# Patient Record
Sex: Female | Born: 1975 | Hispanic: Yes | Marital: Single | Smoking: Never smoker
Health system: Southern US, Community
[De-identification: ages and names within clinical notes are randomized; demographics above are authoritative.]

## PROBLEM LIST (undated history)

## (undated) DIAGNOSIS — F32A Depression, unspecified: Secondary | ICD-10-CM

## (undated) DIAGNOSIS — L811 Chloasma: Secondary | ICD-10-CM

## (undated) DIAGNOSIS — F329 Major depressive disorder, single episode, unspecified: Secondary | ICD-10-CM

## (undated) DIAGNOSIS — F419 Anxiety disorder, unspecified: Secondary | ICD-10-CM

## (undated) DIAGNOSIS — R519 Headache, unspecified: Secondary | ICD-10-CM

## (undated) DIAGNOSIS — J45909 Unspecified asthma, uncomplicated: Secondary | ICD-10-CM

## (undated) DIAGNOSIS — R51 Headache: Secondary | ICD-10-CM

## (undated) DIAGNOSIS — E039 Hypothyroidism, unspecified: Secondary | ICD-10-CM

## (undated) HISTORY — DX: Hypothyroidism, unspecified: E03.9

## (undated) HISTORY — PX: OTHER SURGICAL HISTORY: SHX169

## (undated) HISTORY — DX: Major depressive disorder, single episode, unspecified: F32.9

## (undated) HISTORY — DX: Anxiety disorder, unspecified: F41.9

## (undated) HISTORY — DX: Chloasma: L81.1

## (undated) HISTORY — DX: Headache, unspecified: R51.9

## (undated) HISTORY — DX: Headache: R51

## (undated) HISTORY — DX: Unspecified asthma, uncomplicated: J45.909

## (undated) HISTORY — DX: Depression, unspecified: F32.A

---

## 1997-06-15 HISTORY — PX: DIAGNOSTIC LAPAROSCOPY: SUR761

## 2000-06-15 HISTORY — PX: LAPAROSCOPIC OVARIAN CYSTECTOMY: SUR786

## 2002-06-15 HISTORY — PX: THYROID SURGERY: SHX805

## 2009-06-03 ENCOUNTER — Other Ambulatory Visit: Admission: RE | Admit: 2009-06-03 | Discharge: 2009-06-03 | Payer: Self-pay | Admitting: Gynecology

## 2009-06-03 ENCOUNTER — Ambulatory Visit: Payer: Self-pay | Admitting: Gynecology

## 2009-06-06 ENCOUNTER — Ambulatory Visit: Payer: Self-pay | Admitting: Gynecology

## 2009-10-31 ENCOUNTER — Ambulatory Visit: Payer: Self-pay | Admitting: Gynecology

## 2009-12-05 ENCOUNTER — Ambulatory Visit: Payer: Self-pay | Admitting: Gynecology

## 2010-12-05 ENCOUNTER — Encounter: Payer: Self-pay | Admitting: Gynecology

## 2010-12-09 ENCOUNTER — Other Ambulatory Visit: Payer: Self-pay | Admitting: Gynecology

## 2010-12-09 ENCOUNTER — Encounter (INDEPENDENT_AMBULATORY_CARE_PROVIDER_SITE_OTHER): Payer: BC Managed Care – PPO | Admitting: Gynecology

## 2010-12-09 ENCOUNTER — Other Ambulatory Visit (HOSPITAL_COMMUNITY)
Admission: RE | Admit: 2010-12-09 | Discharge: 2010-12-09 | Disposition: A | Discharge status: Home / Self Care | Payer: BC Managed Care – PPO | Source: Ambulatory Visit | Attending: Gynecology | Admitting: Gynecology

## 2010-12-09 DIAGNOSIS — Z13220 Encounter for screening for lipoid disorders: Secondary | ICD-10-CM

## 2010-12-09 DIAGNOSIS — Z833 Family history of diabetes mellitus: Secondary | ICD-10-CM

## 2010-12-09 DIAGNOSIS — Z124 Encounter for screening for malignant neoplasm of cervix: Secondary | ICD-10-CM | POA: Insufficient documentation

## 2010-12-09 DIAGNOSIS — N938 Other specified abnormal uterine and vaginal bleeding: Secondary | ICD-10-CM

## 2010-12-09 DIAGNOSIS — N949 Unspecified condition associated with female genital organs and menstrual cycle: Secondary | ICD-10-CM

## 2010-12-09 DIAGNOSIS — Z01419 Encounter for gynecological examination (general) (routine) without abnormal findings: Secondary | ICD-10-CM

## 2010-12-19 ENCOUNTER — Encounter: Payer: Self-pay | Admitting: *Deleted

## 2011-01-15 ENCOUNTER — Ambulatory Visit (INDEPENDENT_AMBULATORY_CARE_PROVIDER_SITE_OTHER): Payer: BC Managed Care – PPO | Admitting: *Deleted

## 2011-01-15 DIAGNOSIS — E039 Hypothyroidism, unspecified: Secondary | ICD-10-CM

## 2011-01-15 LAB — TSH: TSH: 1.02 u[IU]/mL (ref 0.350–4.500)

## 2011-01-15 LAB — T4: T4, Total: 8.1 ug/dL (ref 5.0–12.5)

## 2011-01-23 ENCOUNTER — Other Ambulatory Visit: Payer: BC Managed Care – PPO

## 2011-01-26 ENCOUNTER — Other Ambulatory Visit: Payer: Self-pay | Admitting: Gynecology

## 2011-01-26 DIAGNOSIS — E039 Hypothyroidism, unspecified: Secondary | ICD-10-CM

## 2011-01-26 MED ORDER — LEVOTHYROXINE SODIUM 100 MCG PO TABS: 100.0000 ug | ORAL_TABLET | Freq: Every day | ORAL | Status: DC

## 2011-02-20 ENCOUNTER — Other Ambulatory Visit: Payer: BC Managed Care – PPO

## 2011-02-20 ENCOUNTER — Ambulatory Visit (INDEPENDENT_AMBULATORY_CARE_PROVIDER_SITE_OTHER): Payer: BC Managed Care – PPO | Admitting: Gynecology

## 2011-02-20 ENCOUNTER — Ambulatory Visit: Payer: BC Managed Care – PPO | Admitting: Gynecology

## 2011-02-20 ENCOUNTER — Other Ambulatory Visit: Payer: Self-pay

## 2011-02-20 DIAGNOSIS — N80109 Endometriosis of ovary, unspecified side, unspecified depth: Secondary | ICD-10-CM | POA: Insufficient documentation

## 2011-02-20 DIAGNOSIS — N938 Other specified abnormal uterine and vaginal bleeding: Secondary | ICD-10-CM

## 2011-02-20 DIAGNOSIS — IMO0002 Reserved for concepts with insufficient information to code with codable children: Secondary | ICD-10-CM

## 2011-02-20 DIAGNOSIS — D259 Leiomyoma of uterus, unspecified: Secondary | ICD-10-CM | POA: Insufficient documentation

## 2011-02-20 DIAGNOSIS — E039 Hypothyroidism, unspecified: Secondary | ICD-10-CM

## 2011-02-20 DIAGNOSIS — N801 Endometriosis of ovary: Secondary | ICD-10-CM | POA: Insufficient documentation

## 2011-02-20 DIAGNOSIS — N949 Unspecified condition associated with female genital organs and menstrual cycle: Secondary | ICD-10-CM

## 2011-02-20 LAB — TSH: TSH: 0.473 u[IU]/mL (ref 0.350–4.500)

## 2011-02-20 NOTE — Progress Notes (Signed)
Patient's a 35 sure gravida 0 who was seen in the office on 12/09/2010. She had a complete annual gynecological examination at that time. Record and indicated that in 1999 she had a diagnostic laparoscopy and was diagnosed with endometriosis of the right ovary in [place]. In 2002 she had another laparoscopy and had a laparoscopic left ovary and cystectomy also in [place]. She suffers from dyspareunia especially the right lower abdomen during intercourse at first she stated it did not hurt any other times of the month but now it's more frequent. She also complains of brownish discharge in between her periods. She is now using any form of contraception. Menstrual cycles report to be normal 35 day duration. Patient has a history of hypothyroidism for which she is on Synthroid 100 mcg daily. She's had history vitamin D deficiency in the past and is currently taking vitamin D 3 2000 units daily.  Patient had an ultrasound in the office back in December of 2010 she has 5 small fibroids and had a left ovarian thinwall vascular complex mass measuring 48 x 40 x 43 mm consistent with endometrioma and slightly increased over the course of 2 years.  Followup ultrasound today and sonohysterogram: Uterus measured 8.9 x 6.0 x 4.4 cm with an endometrial stripe of 7.9 mm for small fibroids were noted largest one measuring 23 x 16 mm slightly indenting the uterine cavity. Right ovary was normal the left ovary complex thin-walled avascular cysts once again was noted measure 47 x 37 mm. Sonohysterogram with no evidence of intracavitary defects.  We had a discussion today about the findings on ultrasound and her symptomatology and would recommend proceeding with a diagnostic laparoscopy with left ovarian cystectomy possible left oophorectomy possible left salpingo-oophorectomy. We'll plan on concurrently doing a chromopertubation. The risks benefits and pros and cons of the operation were discussed  and literature information was provided. All the above was discussed in Spanish and we'll follow accordingly.  Since she did not get her lab work at time of her annual exam last visit and has history of hypothyroidism we'll check a thyroid panel today along with her CBC and urinalysis.

## 2011-02-23 ENCOUNTER — Telehealth: Payer: Self-pay | Admitting: Anesthesiology

## 2011-02-23 NOTE — Telephone Encounter (Signed)
CALLED PATIENT LEFT MESSAGE TO RETURN MY CALL SO THAT I CAN GIVE HER ALL THE INFORMATION REGARDING HER UPCOMING SURGERY.Marland Kitchen

## 2011-02-26 NOTE — Progress Notes (Signed)
Emily Ritter spoke with patient in Spanish on my behalf. Patient cannot do surgery in Sept as I have scheduled it. She will need end of November or December due to her work schedule.  She was informed that I will call her with November schedule.  She asked me to go ahead and check benefits and let her know about prepayment amount. I will do that in the morning and Emily Ritter will call her then with that info. kra

## 2011-03-10 ENCOUNTER — Telehealth: Payer: Self-pay

## 2011-03-10 NOTE — Telephone Encounter (Signed)
On my behalf, Emily Ritter called this patient (Spanish-speaking) to let her know that the Nov schedule is ready. Emily Ritter said she reviewed the insurance benefits/costs with the patient again and she is going to speak with her husband and call back when ready to schedule.

## 2011-03-13 ENCOUNTER — Ambulatory Visit (HOSPITAL_BASED_OUTPATIENT_CLINIC_OR_DEPARTMENT_OTHER): Admission: RE | Admit: 2011-03-13 | Payer: BC Managed Care – PPO | Source: Ambulatory Visit | Admitting: Gynecology

## 2011-05-06 ENCOUNTER — Telehealth: Payer: Self-pay | Admitting: Anesthesiology

## 2011-05-06 NOTE — Telephone Encounter (Signed)
I called Emily Ritter today to follow up on the surgery that she is supposed to schedule... She said that she is having insurance issues due to going through a separation and her husband is the Stage manager.. As soon as she gets information from her insurance company whether she is covered or not still. She will call and schedule the surgery date.Marland KitchenMarland Kitchen

## 2011-06-03 ENCOUNTER — Telehealth: Payer: Self-pay | Admitting: Anesthesiology

## 2011-06-03 NOTE — Telephone Encounter (Signed)
Called patient to see when she can come back for her follow up on ovarian cyst. Dr. Lily Peer  would like her to come back in to recheck the cyst on u/s and consult about options since she cannot do surgery at this time. .. Patient said she can not afford to come in at this time... She will call me next year to see if she can schedule an appointment then.Marland Kitchen

## 2011-06-03 NOTE — Progress Notes (Signed)
I made Dr. Glenetta Hew aware that patient still unable to schedule surgery due to financial and insurance issues.  He had rec. Ov cystectomy back in June. Dr. Glenetta Hew rec pt return for u/s to recheck and consult to discuss options.  I staff Nada Maclachlan with this request and asked if she will contact patient in Spanish and arrange this for her.

## 2012-08-16 ENCOUNTER — Encounter: Payer: Self-pay | Admitting: Gastroenterology

## 2012-08-18 ENCOUNTER — Other Ambulatory Visit: Payer: Self-pay | Admitting: Obstetrics & Gynecology

## 2012-08-18 DIAGNOSIS — N83209 Unspecified ovarian cyst, unspecified side: Secondary | ICD-10-CM

## 2012-09-06 ENCOUNTER — Encounter: Payer: Self-pay | Admitting: Gastroenterology

## 2012-09-06 ENCOUNTER — Ambulatory Visit (INDEPENDENT_AMBULATORY_CARE_PROVIDER_SITE_OTHER): Payer: BC Managed Care – PPO | Admitting: Gastroenterology

## 2012-09-06 VITALS — BP 98/62 | HR 80 | Ht 62.0 in | Wt 118.2 lb

## 2012-09-06 DIAGNOSIS — K625 Hemorrhage of anus and rectum: Secondary | ICD-10-CM

## 2012-09-06 DIAGNOSIS — K5900 Constipation, unspecified: Secondary | ICD-10-CM

## 2012-09-06 DIAGNOSIS — R1031 Right lower quadrant pain: Secondary | ICD-10-CM

## 2012-09-06 MED ORDER — MOVIPREP 100 G PO SOLR: 1.0000 | Freq: Once | ORAL | Status: DC

## 2012-09-06 NOTE — Progress Notes (Signed)
HPI: This is a  very pleasant 37 year old woman whom I am meeting for the first time today. She is from [place]. Speaks predominantly [place]. She has a friend who is acting as a Nurse, learning disability for her today.  Low right pelvic pain, right leg pain, right low back.  8 months.  Pain is intermittent.    Has intermittent constipation;  Can go 2 times per week.  + minor red blood in her stool.  Red blood dripping into toilett  Pain is usually better with BM.  She has required 2 surgeries for endometriosis. These were both done in [place]. Reviewing previous gynecologic notes it appears that these were right sided issues.  Review of systems: Pertinent positive and negative review of systems were noted in the above HPI section. Complete review of systems was performed and was otherwise normal.    Past Medical History  Diagnosis Date  . Hypothyroidism   . Vitamin D deficiency   . Endometriosis   . Asthma     Past Surgical History  Procedure Laterality Date  . Laparoscopic ovarian cystectomy  2002  . Diagnostic laparoscopy  1999    endometriosis  . Thyroid surgery  2004    lump removed    Current Outpatient Prescriptions  Medication Sig Dispense Refill  . levothyroxine (SYNTHROID, LEVOTHROID) 112 MCG tablet Take 112 mcg by mouth daily.      . Multiple Vitamin (MULTIVITAMIN) tablet Take 1 tablet by mouth daily.       No current facility-administered medications for this visit.    Allergies as of 09/06/2012 - Review Complete 09/06/2012  Allergen Reaction Noted  . Penicillins  12/19/2010    Family History  Problem Relation Age of Onset  . Hypertension Mother   . Hypertension Father   . Heart disease Father   . Uterine cancer Paternal Aunt   . Colon cancer Neg Hx     History   Social History  . Marital Status: Married    Spouse Name: N/A    Number of Children: N/A  . Years of Education: N/A   Occupational History  . Not on file.   Social History Main Topics  .  Smoking status: Never Smoker   . Smokeless tobacco: Never Used  . Alcohol Use: No  . Drug Use: No  . Sexually Active: Not on file   Other Topics Concern  . Not on file   Social History Narrative  . No narrative on file       Physical Exam: BP 98/62  Pulse 80  Ht 5\' 2"  (1.575 m)  Wt 118 lb 3.2 oz (53.615 kg)  BMI 21.61 kg/m2  LMP 08/21/2012 Constitutional: generally well-appearing Psychiatric: alert and oriented x3 Eyes: extraocular movements intact Mouth: oral pharynx moist, no lesions Neck: supple no lymphadenopathy Cardiovascular: heart regular rate and rhythm Lungs: clear to auscultation bilaterally Abdomen: soft, nontender, nondistended, no obvious ascites, no peritoneal signs, normal bowel sounds Extremities: no lower extremity edema bilaterally Skin: no lesions on visible extremities    Assessment and plan: 37 y.o. female with  right sided pelvic pain, improved with defecation, intermittent minor rectal bleeding, constipation.  Her right sided pelvic pain is an unusual location for: Systems. That being said her pain is generally improved when she moves her bowels. Perhaps these are related to previous scar tissue from endometriosis. She does have chronic intermittent constipation for which I have recommended fiber supplements. We will proceed with colonoscopy given her minor rectal bleeding as well as her  chronic constipation and right-sided abdominal pain.

## 2012-09-06 NOTE — Patient Instructions (Addendum)
You will be set up for a colonoscopy for constipation, bleeding. Please start taking citrucel (orange flavored) powder fiber supplement.  This may cause some bloating at first but that usually goes away. Begin with a small spoonful and work your way up to a large, heaping spoonful daily over a week.                                                We are excited to introduce MyChart, a new best-in-class service that provides you online access to important information in your electronic medical record. We want to make it easier for you to view your health information - all in one secure location - when and where you need it. We expect MyChart will enhance the quality of care and service we provide.  When you register for MyChart, you can:    View your test results.    Request appointments and receive appointment reminders via email.    Request medication renewals.    View your medical history, allergies, medications and immunizations.    Communicate with your physician's office through a password-protected site.    Conveniently print information such as your medication lists.  To find out if MyChart is right for you, please talk to a member of our clinical staff today. We will gladly answer your questions about this free health and wellness tool.  If you are age 38 or older and want a member of your family to have access to your record, you must provide written consent by completing a proxy form available at our office. Please speak to our clinical staff about guidelines regarding accounts for patients younger than age 38.  As you activate your MyChart account and need any technical assistance, please call the MyChart technical support line at [phone]) or email your question to [email or web address]. If you email your question(s), please include your name, a return phone number and the best time to reach you.  If you have non-urgent health-related questions, you can  send a message to our office through MyChart at [email or web address]. If you have a medical emergency, call 911.  Thank you for using MyChart as your new health and wellness resource!   MyChart licensed from [employer],  [id number]. Patents Pending.

## 2012-09-07 ENCOUNTER — Ambulatory Visit (INDEPENDENT_AMBULATORY_CARE_PROVIDER_SITE_OTHER): Payer: BC Managed Care – PPO

## 2012-09-07 DIAGNOSIS — N946 Dysmenorrhea, unspecified: Secondary | ICD-10-CM

## 2012-09-07 DIAGNOSIS — IMO0002 Reserved for concepts with insufficient information to code with codable children: Secondary | ICD-10-CM

## 2012-09-07 DIAGNOSIS — D259 Leiomyoma of uterus, unspecified: Secondary | ICD-10-CM

## 2012-09-07 DIAGNOSIS — N83209 Unspecified ovarian cyst, unspecified side: Secondary | ICD-10-CM

## 2012-09-14 ENCOUNTER — Ambulatory Visit (INDEPENDENT_AMBULATORY_CARE_PROVIDER_SITE_OTHER): Payer: BC Managed Care – PPO | Admitting: Obstetrics & Gynecology

## 2012-09-14 ENCOUNTER — Encounter: Payer: Self-pay | Admitting: Obstetrics & Gynecology

## 2012-09-14 VITALS — BP 105/68 | HR 70 | Temp 97.8°F | Ht 62.0 in | Wt 117.0 lb

## 2012-09-14 DIAGNOSIS — N83209 Unspecified ovarian cyst, unspecified side: Secondary | ICD-10-CM

## 2012-09-14 MED ORDER — NORETHIN-ETH ESTRAD-FE BIPHAS 1 MG-10 MCG / 10 MCG PO TABS: 1.0000 | ORAL_TABLET | Freq: Every day | ORAL | Status: DC

## 2012-09-14 NOTE — Patient Instructions (Signed)
Fibromas (Fibroids) Los fibromas son bultos (tumores) que pueden Conservation officer, nature del cuerpo de Nurse, mental health. Estos tumores no son cancerosos. Pueden variar en tamao, peso y lugar en el que crecen. CUIDADOS EN EL HOGAR  No tome aspirina.  Anote el nmero de apsitos o tampones que [place] durante el perodo. Infrmelo a su mdico. Esto puede ayudar a determinar el mejor tratamiento para usted. SOLICITE AYUDA DE INMEDIATO SI:  Siente dolor en la zona inferior del vientre (abdomen) y no se alivia con analgsicos.  Tiene clicos que no se calman con medicamentos  Aumenta el sangrado entre perodos o durante el mismo.  Sufre mareos o se desvanece (se desmaya).  El dolor en el vientre [place]. ASEGRESE DE QUE:  Comprende estas instrucciones.  Controlar su enfermedad.  Solicitar ayuda de inmediato si no mejora o empeora. Document Released: 09/16/2010 Document Revised: 08/24/2011 [facility] Patient Information 2013 [place], [place].  Quiste ovrico (Ovarian Cyst) Los ovarios son pequeos rganos que se encuentran a cada lado del tero. Los ovarios son los rganos que producen las hormonas femeninas, estrgeno y Education officer, museum. Un quiste en el ovario es una bolsa llena de lquido que puede variar en tamao. Es normal que se formen pequeos quistes en las mujeres en edad de procrear y que an tienen sus perodos Designer, jewellery. Este tipo de quiste se denomina quiste folicular que se transforma en un quiste ovulatorio (quiste del cuerpo lteo) despus de producir los vulos. Si la mujer no queda embarazada, desaparece sin ninguna intervencin. Existen otros tipos de quistes de ovario que pueden causar problemas y necesitan ser tratados. El problema ms grave es que el quiste sea canceroso. Debe advertirse que en las mujeres menopusicas que presentan un quiste de ovario, existe un mayor riesgo de que ese quiste sea canceroso. Deben evaluarse muy rpida y [place], y Doctor, general practice. Esto es ms importante en las mujeres menopusicas debido al elevado porcentaje de cncer de ovario durante este perodo. CAUSAS Y TIPOS DE CNCER DE OVARIO:  QUISTE FUNCIONAL: El quiste de folculo o cuerpo lteo es un quiste funcional que aparece todos los meses durante la ovulacin, con el ciclo menstrual. Si la mujer no queda embarazada, desaparecen con el prximo ciclo menstrual. Generalmente los quistes funcionales no presentan sntomas.  ENDOMETRIOMA: este quiste aparece en la superficie del tejido del tero. Un quiste se forma en el interior o [employer]. Cada mes se desarrolla un poco ms debido a la sangre del perodo menstrual. Tambin se denomina "quiste de chocolate" debido a que est lleno de sangre que se vuelve color marrn. Este tipo de quiste causa dolor en la zona inferior del abdomen durante las relaciones sexuales y durante el perodo menstrual.  CISTADENOMA: Se desarrolla a partir de las clulas externas del ovario. Generalmente no son cancerosos. Pueden llegar a ser de gran tamao y causar dolor en la zona baja del abdomen y [employer] sexuales. Este tipo de quiste puede retorcerse e interrumpir el flujo de [place], lo que causa un dolor muy intenso. Tambin puede romperse y Horticulturist, commercial.  QUISTE DERMOIDE: generalmente este tipo de quiste aparece en ambos ovarios. Puede haber diferentes tipos de tejidos en el quiste. Por ejemplo tejidos de piel, dientes, pelos o cartlago. En general no dan sntomas, excepto que sean muy grandes. Los quistes dermoides rara vez son cancerosos.  OVARIO POLIQUSTICO: es una enfermedad rara relacionada con trastornos hormonales que produce muchos quistes pequeos en ambos ovarios. Estos quistes son similares a los [employer]  de folculo pero nunca producen vulos y se transforman en cuerpo lteo. Pueden causar aumento del [employer], infertilidad, acn, aumento del vello facial y corporal y falta de perodos  menstruales o perodos anormales. Muchas mujeres que sufren este problema presentan diabetes tipo 2. La causa exacta de este problema es desconocida. Un ovario poliqustico rara vez es canceroso.  QUISTE OVRICO TECALUTESTICO Aparece cuando hay demasiada hormona (gonadotrofina corinica humana), la que sobreestimula al ovario para producir vulos. Se observan con frecuencia cuando el mdico estimula los ovarios para la fertilizacin in vitro (bebs de probeta).  QUISTE LUTENICO: Aparece durante el embarazo. En algunos casos raros, produce una obstruccin del canal de parto. Generalmente desaparece despus del parto. SNTOMAS  Dolor o molestias en la pelvis.  Dolor durante las [employer].  Aumento de la inflamacin en el abdomen.  Perodos menstruales anormales.  Aumento del [employer] perodos [place].  Deja de menstruar y no est embarazada. DIAGNSTICO El diagnstico puede realizarse durante:  Los exmenes plvicos anuales o de rutina (frecuente).  Ecografas  Radiografas de la pelvis.  Tomografa computada  Resonancia magntica..  Anlisis de sangre. TRATAMIENTO  El tratamiento slo consiste en que el mdico controle el quiste [place], durante 2  3 meses. Muchos desaparecen espontnemente, especialmente los quistes funcionales.  Puede aspirarse (secarse) con Marella Bile larga observndolo en una ecografa, o por laparoscopa (insertando un tubo en la pelvis a travs de una pequea incisin).  El quiste puede extirparse con laparoscopa.  En algunos casos es necesario extirparlo a travs de una incisin en la zona inferior del abdomen.  El tratamiento hormonal se utiliza para Restaurant manager, fast food ciertos tipos de [place].  Las pldoras anticonceptivas pueden utilizarse para Restaurant manager, fast food otros tipos. INSTRUCCIONES PARA EL CUIDADO DOMICILIARIO Siga las indicaciones del profesional con respecto a:  Medicamentos  Visitas de control para evaluar y English as a second language teacher.  Puede ser necesario que tenga que volver o concertar una cita con otro profesional para descubrir la causa exacta del quiste, si su mdico no es Research scientist (physical sciences).  Realice un examen plvico y un [place] todos los aos, segn las indicaciones.  Informe al mdico si tubo un quiste de ovario en el pasado. SOLICITE ATENCIN MDICA SI:  Los perodos se atrasan, son irregulares, le faltan o son dolorosos.  El dolor abdominal (en el vientre) o en la pelvis persisten.  El abdomen se agranda o se hincha.  Siente una opresin en la vejiga o tiene problemas para vaciarla completamente.  Tiene dolor durante las [employer].  Tiene la sensacin de hinchazn, presin o molestias en el abdomen.  Pierde peso sin razn aparente.  Siente un Engineer, maintenance (IT).  Est constipada.  Pierde el apetito.  Aparece acn.  Aumenta el vello facial y Personal assistant.  Lenora Boys de peso sin hacer modificaciones en su actividad fsica y en su dieta habitual.  Sospecha que est embarazada. SOLICITE ATENCIN MDICA DE INMEDIATO SI:  Siente dolor abdominal cada vez ms intenso.  Si tiene ganas de vomitar (nuseas).  Le sube repentinamente la fiebre.  Siente dolor abdominal al mover el intestino.  Sus perodos menstruales son ms abundantes que lo habitual. Document Released: 03/11/2005 Document Revised: 08/24/2011 [facility] Patient Information 2013 [place], [place].

## 2012-09-14 NOTE — Progress Notes (Signed)
.   Subjective:     Emily Ritter is a 37 y.o. female here for her ultrasound results - done 09/07/12.  Current complaints: right sided pelvic pain.  Personal health questionnaire reviewed: not asked.   Gynecologic History Patient's last menstrual period was 08/21/2012. Contraception:  None Last Pap: 03/2012 - normal Last mammogram: N/A     The following portions of the patient's history were reviewed and updated as appropriate: allergies, current medications, past family history, past medical history, past social history, past surgical history and problem list.  Review of Systems Pertinent items are noted in HPI.    Objective:     No exam today     Assessment:    Small fibroid uterus, unilateral hemorrhagic ovarian cyst  Plan:    Follow up in: several months. Keep menstrual calendar; attempt to regulate cycles w/COCP

## 2012-09-22 ENCOUNTER — Ambulatory Visit: Payer: Self-pay | Admitting: Obstetrics & Gynecology

## 2012-10-03 ENCOUNTER — Telehealth: Payer: Self-pay | Admitting: Gastroenterology

## 2012-10-03 ENCOUNTER — Encounter: Payer: Self-pay | Admitting: *Deleted

## 2012-10-03 MED ORDER — MOVIPREP 100 G PO SOLR: 1.0000 | Freq: Once | ORAL | Status: DC

## 2012-10-03 NOTE — Telephone Encounter (Signed)
Pt is aware movi prep has been resent

## 2012-10-05 ENCOUNTER — Encounter: Payer: Self-pay | Admitting: Gastroenterology

## 2012-10-05 ENCOUNTER — Ambulatory Visit (AMBULATORY_SURGERY_CENTER): Payer: BC Managed Care – PPO | Admitting: Gastroenterology

## 2012-10-05 ENCOUNTER — Other Ambulatory Visit: Payer: BC Managed Care – PPO | Admitting: Gastroenterology

## 2012-10-05 VITALS — BP 107/56 | HR 60 | Temp 98.9°F | Resp 15 | Ht 62.0 in | Wt 118.0 lb

## 2012-10-05 DIAGNOSIS — K5900 Constipation, unspecified: Secondary | ICD-10-CM

## 2012-10-05 DIAGNOSIS — K625 Hemorrhage of anus and rectum: Secondary | ICD-10-CM

## 2012-10-05 DIAGNOSIS — K644 Residual hemorrhoidal skin tags: Secondary | ICD-10-CM

## 2012-10-05 DIAGNOSIS — R1031 Right lower quadrant pain: Secondary | ICD-10-CM

## 2012-10-05 MED ORDER — SODIUM CHLORIDE 0.9 % IV SOLN: 500.0000 mL | INTRAVENOUS | Status: DC

## 2012-10-05 NOTE — Progress Notes (Signed)
Patient did not experience any of the following events: a burn prior to discharge; a fall within the facility; wrong site/side/patient/procedure/implant event; or a hospital transfer or hospital admission upon discharge from the facility. (G8907) Patient did not have preoperative order for IV antibiotic SSI prophylaxis. (G8918)  

## 2012-10-05 NOTE — Patient Instructions (Signed)
Small internal hemorrhoids seen. Handout given on hemorrhoids. Continue once daily fiber supplements for your constipation.  Resume current medications. Call us with any questions or concerns. Thank you!  YOU HAD AN ENDOSCOPIC PROCEDURE TODAY AT THE [facility]: Refer to the procedure report that was given to you for any specific questions about what was found during the examination.  If the procedure report does not answer your questions, please call your gastroenterologist to clarify.  If you requested that your care partner not be given the details of your procedure findings, then the procedure report has been included in a sealed envelope for you to review at your convenience later.  YOU SHOULD EXPECT: Some feelings of bloating in the abdomen. Passage of more gas than usual.  Walking can help get rid of the air that was put into your GI tract during the procedure and reduce the bloating. If you had a lower endoscopy (such as a colonoscopy or flexible sigmoidoscopy) you may notice spotting of blood in your stool or on the toilet paper. If you underwent a bowel prep for your procedure, then you may not have a normal bowel movement for a few days.  DIET: Your first meal following the procedure should be a light meal and then it is ok to progress to your normal diet.  A half-sandwich or bowl of soup is an example of a good first meal.  Heavy or fried foods are harder to digest and may make you feel nauseous or bloated.  Likewise meals heavy in dairy and vegetables can cause extra gas to form and this can also increase the bloating.  Drink plenty of fluids but you should avoid alcoholic beverages for 24 hours.  ACTIVITY: Your care partner should take you home directly after the procedure.  You should plan to take it easy, moving slowly for the rest of the day.  You can resume normal activity the day after the procedure however you should NOT DRIVE or use heavy machinery for 24 hours (because of  the sedation medicines used during the test).    SYMPTOMS TO REPORT IMMEDIATELY: A gastroenterologist can be reached at any hour.  During normal business hours, 8:30 AM to 5:00 PM Monday through Friday, call [phone].  After hours and on weekends, please call the GI answering service at [phone] who will take a message and have the physician on call contact you.   Following lower endoscopy (colonoscopy or flexible sigmoidoscopy):  Excessive amounts of blood in the stool  Significant tenderness or worsening of abdominal pains  Swelling of the abdomen that is new, acute  Fever of 100F or higher  Following upper endoscopy (EGD)  Vomiting of blood or coffee ground material  New chest pain or pain under the shoulder blades  Painful or persistently difficult swallowing  New shortness of breath  Fever of 100F or higher  Black, tarry-looking stools  FOLLOW UP: If any biopsies were taken you will be contacted by phone or by letter within the next 1-3 weeks.  Call your gastroenterologist if you have not heard about the biopsies in 3 weeks.  Our staff will call the home number listed on your records the next business day following your procedure to check on you and address any questions or concerns that you may have at that time regarding the information given to you following your procedure. This is a courtesy call and so if there is no answer at the home number and we have  not heard from you through the emergency physician on call, we will assume that you have returned to your regular daily activities without incident.  SIGNATURES/CONFIDENTIALITY: You and/or your care partner have signed paperwork which will be entered into your electronic medical record.  These signatures attest to the fact that that the information above on your After Visit Summary has been reviewed and is understood.  Full responsibility of the confidentiality of this discharge information lies with you and/or your  care-partner.

## 2012-10-05 NOTE — Op Note (Signed)
[facility] [address] [place], [address]   COLONOSCOPY PROCEDURE REPORT  PATIENT: Emily, Ritter  MR#: [id number] BIRTHDATE: Jan 13, 1976 , 36  yrs. old GENDER: Female ENDOSCOPIST: Rachael Fee, MD REFERRED XB:JYNW Lauro Regulus, M.D. PROCEDURE DATE:  10/05/2012 PROCEDURE:   Colonoscopy, diagnostic ASA CLASS:   Class II INDICATIONS:constipation, rectal bleeding. MEDICATIONS: Fentanyl 75 mcg IV, Versed 7 mg IV, and These medications were titrated to patient response per physician's verbal order  DESCRIPTION OF PROCEDURE:   After the risks benefits and alternatives of the procedure were thoroughly explained, informed consent was obtained.  A digital rectal exam revealed no abnormalities of the rectum.   The LB [id number]-[id number]  endoscope was introduced through the anus and advanced to the cecum, which was identified by both the appendix and ileocecal valve. No adverse events experienced.   The quality of the prep was good, using MoviPrep  The instrument was then slowly withdrawn as the colon was fully examined.   COLON FINDINGS: There were small internal hemorrhoids.  The examination was otherwise normal.  Retroflexed views revealed no abnormalities. The time to cecum=2 minutes 09 seconds.  Withdrawal time=8 minutes 50 seconds.  The scope was withdrawn and the procedure completed. COMPLICATIONS: There were no complications.  ENDOSCOPIC IMPRESSION: There were small internal hemorrhoids. The examination was otherwise normal.  No polyps or cancers  RECOMMENDATIONS: Continue once daily fiber supplements for your constipation.   eSigned:  Rachael Fee, MD 10/05/2012 4:16 PM

## 2012-10-06 ENCOUNTER — Telehealth: Payer: Self-pay | Admitting: *Deleted

## 2012-10-06 NOTE — Telephone Encounter (Signed)
No answer, left message to call office if questions or concerns. 

## 2012-10-19 ENCOUNTER — Other Ambulatory Visit: Payer: BC Managed Care – PPO | Admitting: Gastroenterology

## 2012-11-16 ENCOUNTER — Other Ambulatory Visit: Payer: BC Managed Care – PPO

## 2012-11-16 ENCOUNTER — Ambulatory Visit (INDEPENDENT_AMBULATORY_CARE_PROVIDER_SITE_OTHER): Payer: BC Managed Care – PPO | Admitting: Obstetrics & Gynecology

## 2012-11-16 ENCOUNTER — Encounter: Payer: Self-pay | Admitting: Obstetrics & Gynecology

## 2012-11-16 ENCOUNTER — Other Ambulatory Visit: Payer: Self-pay | Admitting: Obstetrics & Gynecology

## 2012-11-16 ENCOUNTER — Other Ambulatory Visit: Payer: Self-pay | Admitting: *Deleted

## 2012-11-16 ENCOUNTER — Ambulatory Visit (INDEPENDENT_AMBULATORY_CARE_PROVIDER_SITE_OTHER): Payer: BC Managed Care – PPO

## 2012-11-16 VITALS — BP 121/80 | HR 62 | Temp 97.8°F | Ht 62.0 in | Wt 117.0 lb

## 2012-11-16 DIAGNOSIS — D259 Leiomyoma of uterus, unspecified: Secondary | ICD-10-CM

## 2012-11-16 DIAGNOSIS — N83209 Unspecified ovarian cyst, unspecified side: Secondary | ICD-10-CM

## 2012-11-16 DIAGNOSIS — N80109 Endometriosis of ovary, unspecified side, unspecified depth: Secondary | ICD-10-CM

## 2012-11-16 DIAGNOSIS — N801 Endometriosis of ovary: Secondary | ICD-10-CM

## 2012-11-16 DIAGNOSIS — Z30431 Encounter for routine checking of intrauterine contraceptive device: Secondary | ICD-10-CM

## 2012-11-16 NOTE — Patient Instructions (Addendum)
Endometritis (Endometritis)  La endometritis es la irritacin , dolor e hinchazn (inflamacin) en la membrana que tapiza el tero (endometrio).  CAUSAS   Infecciones bacterianas.  Enfermedades de transmisin sexual (ETS).  Haber sufrido un aborto o despus de un parto, especialmente despus de un trabajo de parto prolongado o una cesrea.  Ciertos procedimientos ginecolgicos (dilatacin y curetaje, histeroscopa o la insercin de un dispositivo anticonceptivo). SNTOMAS   Grant Ruts.  Dolor en la zona baja del abdomen o dolor plvico.  Secrecin o sangrado vaginal anormal.  Hinchazn abdominal o meteorismo (distensin).  Malestar general, sentirse enferma.  Molestias al mover el intestino. DIAGNSTICO Le indicarn un examen fsico y de la pelvis. Otras pruebas son:   Cultivos de material obtenido en el cuello del tero.  Anlisis de [place].  Examen de [place] de tejido de las membranas que tapizan el tero (biopsia de endometrio).  Examen de las secreciones en el microscopio (preparado fresco).  Laparoscopa. TRATAMIENTO Generalmente se indican antibiticos. Otros tratamientos pueden ser:   Administracin de lquidos por la vena (lquidos por va intravenosa).  Hacer reposo. INSTRUCCIONES PARA EL CUIDADO DOMICILIARIO  Utilice los medicamentos de venta libre o de prescripcin para Chief Technology Officer, el malestar o la [place], segn se lo indique el profesional que lo asiste.  Puede reanudar su dieta y sus actividades normales segn se le haya indicado o segn su tolerancia.  No se haga duchas vaginales ni mantenga relaciones sexuales hasta que el cirujano se lo permita.  Tome los antibiticos como se le indic. Tmelos todos, aunque se sienta mejor.  No tenga relaciones sexuales hasta que su compaero haya sido tratado si la causa de la cervicitis es una enfermedad de transmisin sexual. SOLICITE ATENCIN MDICA DE INMEDIATO SI:  Presenta hinchazn o aumento del dolor en  el abdomen.  Tiene fiebre.  Tiene una secrecin vaginal con mal olor, o ha aumentado la cantidad de Midwife.  Brett Fairy hemorragia vaginal anormal.  El medicamento no le Scientist, clinical (histocompatibility and immunogenetics).  Tiene algn problema que puede relacionarse con el medicamento que est tomando.  Comienza a sentir nuseas y vmitos o no puede [employer].  Siente dolor al mover el intestino. EST SEGURO QUE:   Comprende las instrucciones para el alta mdica.  Controlar su enfermedad.  Solicitar atencin mdica de inmediato segn las indicaciones. Document Released: 03/11/2005 Document Revised: 08/24/2011 [facility] Patient Information 2014 [place], [place].

## 2012-11-16 NOTE — Progress Notes (Signed)
.   Subjective:     Emily Ritter is a 37 y.o. female here for her ultrasound results - done today.   No current complaints.  Personal health questionnaire reviewed: no.   Gynecologic History No LMP recorded. Contraception: OCP (estrogen/progesterone) Last Pap: 03/2012. Results were:normal Last mammogram: N/A  Obstetric History OB History   Grav Para Term Preterm Abortions TAB SAB Ect Mult Living                   The following portions of the patient's history were reviewed and updated as appropriate: allergies, current medications, past family history, past medical history, past social history, past surgical history and problem list.  Review of Systems Pertinent items are noted in HPI.    Objective:     No exam today     Assessment:   Small endometrioma Minimal symptoms  Plan:   Repeat U/S, f/u in 6 mths

## 2012-12-01 ENCOUNTER — Ambulatory Visit: Payer: BC Managed Care – PPO | Admitting: Obstetrics & Gynecology

## 2012-12-07 ENCOUNTER — Encounter: Payer: Self-pay | Admitting: Obstetrics

## 2013-03-15 ENCOUNTER — Other Ambulatory Visit: Payer: Self-pay | Admitting: Obstetrics & Gynecology

## 2013-03-15 DIAGNOSIS — N83209 Unspecified ovarian cyst, unspecified side: Secondary | ICD-10-CM

## 2013-03-22 ENCOUNTER — Encounter: Payer: Self-pay | Admitting: Obstetrics & Gynecology

## 2013-03-22 ENCOUNTER — Ambulatory Visit (INDEPENDENT_AMBULATORY_CARE_PROVIDER_SITE_OTHER): Payer: BC Managed Care – PPO | Admitting: Obstetrics & Gynecology

## 2013-03-22 ENCOUNTER — Ambulatory Visit (INDEPENDENT_AMBULATORY_CARE_PROVIDER_SITE_OTHER): Payer: BC Managed Care – PPO

## 2013-03-22 VITALS — Temp 98.3°F | Ht 62.0 in | Wt 123.6 lb

## 2013-03-22 DIAGNOSIS — N949 Unspecified condition associated with female genital organs and menstrual cycle: Secondary | ICD-10-CM

## 2013-03-22 DIAGNOSIS — N801 Endometriosis of ovary: Secondary | ICD-10-CM

## 2013-03-22 DIAGNOSIS — D259 Leiomyoma of uterus, unspecified: Secondary | ICD-10-CM

## 2013-03-22 DIAGNOSIS — N80109 Endometriosis of ovary, unspecified side, unspecified depth: Secondary | ICD-10-CM

## 2013-03-22 DIAGNOSIS — N83209 Unspecified ovarian cyst, unspecified side: Secondary | ICD-10-CM

## 2013-03-22 NOTE — Patient Instructions (Signed)
Endometriosis (Endometriosis) La endometriosis es un trastorno que se produce cuando existen porciones de endometrio (el recubrimiento del tero) fuera de su ubicacin normal. Puede ocurrir en muchos lugares prximos al tero (matriz), pero generalmente se produce en los ovarios, en las trompas de [place], en la vagina (canal de parto) y en los intestinos que se encuentran cerca del tero. Debido a que el tero elimina (expulsa) su recubrimiento todos los meses (menstruacin), hay una Cabin crew en el que el tejido endometrial est ubicado. SNTOMAS Generalmente no se presentan sntomas (problemas); sin embargo, debido a que la sangre irrita los tejidos que normalmente no estn expuestos a ella, cuando se producen los sntomas, estos varan segn Immunologist al cual se haya desplazado el endometrio. Entre los sntomas se incluyen el dolor de espalda y el dolor abdominal (vientre). Los perodos pueden ser ms abundantes y las relaciones sexuales dolorosas. Puede haber infertilidad. Usted podr sufrir todos estos sntomas al [employer] o no, o puede haber meses en los que no tenga ningn sntoma. Aunque los sntomas se producen principalmente durante las menstruaciones, tambin pueden aparecer en la mitad del ciclo y generalmente terminan con la menopausia. DIAGNSTICO El profesional que la asiste podr indicarle un examen de sangre y de [place] para descartar otros trastornos. Otra prueba frecuente en estos casos es el [place], un procedimiento indoloro que utiliza ondas de sonido para hacer una ecografa del tejido anormal que indica endometriosis. Si siente dolor al mover el intestino durante el perodo, el profesional que la asiste podr indicarle un enema de bario (radiografa de la parte inferior del intestino), para tratar de Contractor origen del dolor. A veces se confirma por medio de una laparoscopa. La laparoscopa es un procedimiento en el que el profesional observa el interior del  abdomen con un laparoscopio (un pequeo telescopio con forma de lpiz). El profesional podr tomar una pequea muestra de tejido (biopsia) de los tejidos anormales para confirmar o Psychiatric nurse problema. Los tejidos se envan al laboratorio y un patlogo los observa bajo el microscopio para dar un diagnstico. TRATAMIENTO Una vez que se realiza el diagnstico, puede tratarse con la destruccin del tejido endometrial desplazado, utilizando calor (diatermia), lser, escisin (corte), o por medios qumicos. Tambin puede tratarse con terapia hormonal. Cuando se utiliza la terapia hormonal, se eliminan las [place], por lo tanto se elimina la exposicin mensual a la sangre del tejido endometrial desplazado. Slo en los casos graves es necesario realizar una histerectoma con extirpacin de las trompas, el tero y los ovarios. INSTRUCCIONES PARA EL CUIDADO DOMICILIARIO  Utilice los medicamentos de venta libre o de prescripcin para Chief Technology Officer, el malestar o la [place], segn se lo indique el profesional que lo asiste.  Evite las actividades que producen dolor, incluyendo las [employer].  No tome aspirinas ya que puede aumentar las hemorragias cuando no recibe terapia hormonal.  Consulte al profesional que la asiste si siente dolor o tiene problemas que no puede controlar con Scientist, research (medical). SOLICITE ATENCIN MDICA DE INMEDIATO SI:  El dolor es intenso y no responde a Tourist information centre manager.  Comienza a sentir nuseas y vmitos o no puede [employer].  El dolor se localiza en la zona inferior derecha del abdomen (posible apendicitis).  Presenta hinchazn o aumento del dolor en el abdomen.  Tiene fiebre.  Observa sangre en la materia fecal. EST SEGURO QUE:   Comprende las instrucciones para el alta mdica.  Controlar su enfermedad.  Solicitar atencin mdica de inmediato segn  las indicaciones. Document Released: 06/01/2005 Document Revised: 08/24/2011 [facility] Patient  Information 2014 [place], [place].

## 2013-03-22 NOTE — Progress Notes (Signed)
.   Subjective:     Emily Ritter is a 37 y.o. female here for her ultrasound results - done today 03/22/13.  Personal health questionnaire reviewed: no.   Gynecologic History No LMP recorded. Patient is not currently having periods (Reason: Oral contraceptives). Contraception: OCP (estrogen/progesterone) Last Pap: 03/2012. Results were: normal Last mammogram: N/A.  Obstetric History OB History  No data available     The following portions of the patient's history were reviewed and updated as appropriate: allergies, current medications, past family history, past medical history, past social history, past surgical history and problem list.  Review of Systems Pertinent items are noted in HPI.    Objective:     No exam today     Assessment:   Small, asymptomatic endometrioma <5 cm  Doubt malignancy; fertility not an issue Plan:    She elects to defer surgery Continue continuous COCP Return prn or in 6 mths

## 2013-05-01 ENCOUNTER — Encounter: Payer: Self-pay | Admitting: Obstetrics & Gynecology

## 2013-09-25 ENCOUNTER — Other Ambulatory Visit: Payer: Self-pay | Admitting: *Deleted

## 2013-09-25 DIAGNOSIS — D259 Leiomyoma of uterus, unspecified: Secondary | ICD-10-CM

## 2013-09-27 ENCOUNTER — Encounter: Payer: Self-pay | Admitting: Obstetrics & Gynecology

## 2013-09-27 ENCOUNTER — Ambulatory Visit (INDEPENDENT_AMBULATORY_CARE_PROVIDER_SITE_OTHER): Payer: BC Managed Care – PPO

## 2013-09-27 ENCOUNTER — Ambulatory Visit (INDEPENDENT_AMBULATORY_CARE_PROVIDER_SITE_OTHER): Payer: BC Managed Care – PPO | Admitting: Obstetrics & Gynecology

## 2013-09-27 VITALS — BP 104/69 | HR 59 | Temp 98.0°F | Ht 62.0 in | Wt 121.0 lb

## 2013-09-27 DIAGNOSIS — N80109 Endometriosis of ovary, unspecified side, unspecified depth: Secondary | ICD-10-CM

## 2013-09-27 DIAGNOSIS — N801 Endometriosis of ovary: Secondary | ICD-10-CM

## 2013-09-27 DIAGNOSIS — D259 Leiomyoma of uterus, unspecified: Secondary | ICD-10-CM

## 2013-09-27 MED ORDER — NORETHINDRONE 0.35 MG PO TABS: 1.0000 | ORAL_TABLET | Freq: Every day | ORAL | Status: DC

## 2013-09-27 NOTE — Progress Notes (Signed)
Subjective:     Emily Ritter is a 38 y.o. female here for follow up visit for endometriotic cysts. She reports RLQ pain that radiate to her leg since discontinuing the continuous COCP secondary to a chloasma. The HPI was reviewed and explored in further detail by the provider. Gynecologic History Patient's last menstrual period was 09/16/2013. Contraception: none Last Pap: 11/24/2011. Results were: normal  Obstetric History OB History  Gravida Para Term Preterm AB SAB TAB Ectopic Multiple Living  0 0 0 0 0 0 0 0 0 0         Past Medical History  Diagnosis Date  . Hypothyroidism   . Vitamin D deficiency   . Endometriosis   . Asthma     Past Surgical History  Procedure Laterality Date  . Laparoscopic ovarian cystectomy  2002  . Diagnostic laparoscopy  1999    endometriosis  . Thyroid surgery  2004    lump removed     (Not in a hospital admission) Allergies  Allergen Reactions  . Penicillins     History  Substance Use Topics  . Smoking status: Never Smoker   . Smokeless tobacco: Never Used  . Alcohol Use: No    Family History  Problem Relation Age of Onset  . Hypertension Mother   . Hypertension Father   . Heart disease Father   . Uterine cancer Paternal Aunt   . Colon cancer Neg Hx       Review of Systems  Constitutional: negative for fatigue and weight loss Respiratory: negative for cough and wheezing Cardiovascular: negative for chest pain, fatigue and palpitations Gastrointestinal: positive for abdominal pain; no change in bowel habits Musculoskeletal:negative for myalgias Neurological: negative for gait problems and tremors Behavioral/Psych: negative for abusive relationship, depression Endocrine: negative for temperature intolerance   Genitourinary:negative for abnormal menstrual periods, genital lesions, hot flashes, sexual problems and vaginal discharg Integument/breast: negative for breast lump, breast tenderness, nipple discharge and skin  lesion(s)    Objective:       General:   alert  Skin:   no rash or abnormalities  Lungs:   clear to auscultation bilaterally  Heart:   regular rate and rhythm, S1, S2 normal, no murmur, click, rub or gallop  Breasts:   normal without suspicious masses, skin or nipple changes or axillary nodes  Abdomen:  normal findings: no organomegaly, soft, non-tender and no hernia  Pelvis:  External genitalia: normal general appearance Urinary system: urethral meatus normal and bladder without fullness, nontender Vaginal: normal without tenderness, induration or masses Cervix: normal appearance Adnexa: normal bimanual exam Uterus: anteverted and non-tender, normal size   Lab Review  Radiologic studies reviewed yes; U/S today showed bilateral endometriomas L>R  80% of 15 min visit spent on counseling and coordination of care.   Assessment:      Patient Active Problem List   Diagnosis Date Noted  . Endometriosis of ovary 02/20/2011    02/20/2011    Symptoms controlled on COCP w/secondary skin hyperpigentation Plan:  [place] Offered surgery/bilateral ovarian cystectomies Follow up as needed or in 6 mths

## 2014-02-20 ENCOUNTER — Encounter: Payer: Self-pay | Admitting: Internal Medicine

## 2014-02-20 ENCOUNTER — Ambulatory Visit (INDEPENDENT_AMBULATORY_CARE_PROVIDER_SITE_OTHER): Payer: BC Managed Care – PPO | Admitting: Internal Medicine

## 2014-02-20 VITALS — BP 110/82 | HR 67 | Temp 98.2°F | Ht 63.0 in | Wt 126.2 lb

## 2014-02-20 DIAGNOSIS — F341 Dysthymic disorder: Secondary | ICD-10-CM

## 2014-02-20 DIAGNOSIS — F419 Anxiety disorder, unspecified: Principal | ICD-10-CM

## 2014-02-20 DIAGNOSIS — F32A Depression, unspecified: Secondary | ICD-10-CM | POA: Insufficient documentation

## 2014-02-20 DIAGNOSIS — F329 Major depressive disorder, single episode, unspecified: Secondary | ICD-10-CM

## 2014-02-20 DIAGNOSIS — E039 Hypothyroidism, unspecified: Secondary | ICD-10-CM | POA: Insufficient documentation

## 2014-02-20 MED ORDER — FLUOXETINE HCL 20 MG PO TABS: 20.0000 mg | ORAL_TABLET | Freq: Every day | ORAL | Status: DC

## 2014-02-20 NOTE — Progress Notes (Signed)
Subjective:    Patient ID: Emily Ritter, female    DOB: 07-12-1975, 38 y.o.   MRN: [id number]  DOS:  02/20/2014 Type of visit - description : new pt, previous PCP Dr Karle Starch  History of hypothyroidism,  Has not seen her PCP in more than a year .She self decreased Synthroid 112 mcg to half tablet daily 4 weeks ago because she was feeling anxious and she felt that synthroid may be the cause of her anxiety. As far as anxiety, symptoms started about 1-1/2 years ago, she gets very nervous, has palpitations, chest pressure and blurred  vision whenever she drives her car, worse when she drives on a highway. Symptoms were not preceded by any accident. She tried lorazepam that make her very sleepy. Then she saw the counselor, she's doing better when she drives in the city but still having problems in the highway. Would like some treatment.   ROS Denies fever, chills or weight loss No nausea, vomiting, diarrhea or blood in the stools recently. Currently with no depression symptoms No symptoms of anxiety under other circumstances except when she drives   Past Medical History  Diagnosis Date  . Hypothyroidism   . Vitamin D deficiency   . Endometriosis   . Asthma   . Anxiety and depression   . Frequent headaches     Past Surgical History  Procedure Laterality Date  . Laparoscopic ovarian cystectomy  2002  . Diagnostic laparoscopy  1999    endometriosis  . Thyroid surgery  2004    lump removed    History   Social History  . Marital Status: Married    Spouse Name: N/A    Number of Children: 0  . Years of Education: N/A   Occupational History  . [employer]     Social History Main Topics  . Smoking status: Never Smoker   . Smokeless tobacco: Never Used  . Alcohol Use: No  . Drug Use: No  . Sexual Activity: Yes    Partners: Male    Birth Control/ Protection: None   Other Topics Concern  . Not on file   Social History Narrative   Original from [place], moved to [place] ~ 2005   Divorced, lives w/ a room mate      Family History  Problem Relation Age of Onset  . Hypertension Mother   . Hypertension Father   . Heart disease Father     onset age 18, stent , CABG  . Uterine cancer Paternal Aunt   . Colon cancer Neg Hx   . Breast cancer Neg Hx        Medication List       This list is accurate as of: 02/20/14  4:26 PM.  Always use your most recent med list.               FLUoxetine 20 MG tablet  Commonly known as:  PROZAC  Take 1 tablet (20 mg total) by mouth daily.     levothyroxine 112 MCG tablet  Commonly known as:  SYNTHROID, LEVOTHROID  Take 112 mcg by mouth daily.     multivitamin tablet  Take 1 tablet by mouth daily.           Objective:   Physical Exam BP 110/82  Pulse 67  Temp(Src) 98.2 F (36.8 C) (Oral)  Ht 5\' 3"  (1.6 m)  Wt 126 lb 4 oz (57.267 kg)  BMI 22.37 kg/m2  SpO2 97%  LMP 02/05/2014 General -- alert,  well-developed, NAD.  Neck --no thyromegaly Lungs -- normal respiratory effort, no intercostal retractions, no accessory muscle use, and normal breath sounds.  Heart-- normal rate, regular rhythm, no murmur.   Extremities-- no pretibial edema bilaterally  Neurologic--  alert & oriented X3. Speech normal, gait appropriate for age, strength symmetric and appropriate for age.    Psych-- Cognition and judgment appear intact. Cooperative with normal attention span and concentration. No anxious or depressed appearing.        Assessment & Plan:   Today , I spent more than  35  min with the patient: >50% of the time counseling regards  Anxiety treatment options , counseling her about synthroid and why i don't believe it is causing anxiety

## 2014-02-20 NOTE — Patient Instructions (Signed)
Get your blood work before you leave   Start fluoxetine 20 mg: 1/2 tablet in the morning for 1 week, then 1 tablet every morning   Please come back to the office in 4 to 6 weeks for a routine visit , no  fasting Stop by the front desk and schedule the visit

## 2014-02-20 NOTE — Progress Notes (Signed)
Pre visit review using our clinic review tool, if applicable. No additional management support is needed unless otherwise documented below in the visit note. 

## 2014-02-20 NOTE — Assessment & Plan Note (Addendum)
1.5 years history of anxiety related to driving, she is already undergoing counseling and has improved to some extent,   now is able to drive in the city  without major problems but still having issues at the highway. No depression at this time. She got very somnolent with lorazepam We discussed the options other SSRIs and she is willing to try. Plan: Start fluoxetine, followup in 4-6 weeks, Continue seeing a Medical illustrator

## 2014-02-20 NOTE — Assessment & Plan Note (Addendum)
History Of a thyroid nodule removal many years ago, started to take Synthroid about 10 years ago. About 4 weeks ago she self decreased  Synthroid 112 mcg to half tablet today because she was anxious Advised pt that unless she is overtreated, Synthroid should not cause anxiety the way she describes it Plan: TSH, further advice w/  result

## 2014-02-21 LAB — TSH: TSH: 1.96 u[IU]/mL (ref 0.35–4.50)

## 2014-02-21 LAB — BASIC METABOLIC PANEL
BUN: 11 mg/dL (ref 6–23)
CO2: 25 mEq/L (ref 19–32)
Calcium: 9.3 mg/dL (ref 8.4–10.5)
Chloride: 107 mEq/L (ref 96–112)
Creatinine, Ser: 0.6 mg/dL (ref 0.4–1.2)
GFR: 120.4 mL/min (ref >60.00)
Glucose, Bld: 80 mg/dL (ref 70–99)
Potassium: 4.2 mEq/L (ref 3.5–5.1)
SODIUM: 138 meq/L (ref 135–145)

## 2014-03-20 ENCOUNTER — Ambulatory Visit (INDEPENDENT_AMBULATORY_CARE_PROVIDER_SITE_OTHER): Payer: BC Managed Care – PPO | Admitting: Internal Medicine

## 2014-03-20 ENCOUNTER — Encounter: Payer: Self-pay | Admitting: Internal Medicine

## 2014-03-20 VITALS — BP 122/74 | HR 68 | Temp 98.8°F | Wt 123.4 lb

## 2014-03-20 DIAGNOSIS — F419 Anxiety disorder, unspecified: Secondary | ICD-10-CM

## 2014-03-20 DIAGNOSIS — E890 Postprocedural hypothyroidism: Secondary | ICD-10-CM

## 2014-03-20 DIAGNOSIS — F32A Depression, unspecified: Secondary | ICD-10-CM

## 2014-03-20 DIAGNOSIS — F418 Other specified anxiety disorders: Secondary | ICD-10-CM

## 2014-03-20 DIAGNOSIS — F329 Major depressive disorder, single episode, unspecified: Secondary | ICD-10-CM

## 2014-03-20 LAB — TSH: TSH: 2.73 u[IU]/mL (ref 0.35–4.50)

## 2014-03-20 MED ORDER — LEVOTHYROXINE SODIUM 112 MCG PO TABS: 112.0000 ug | ORAL_TABLET | Freq: Every day | ORAL | Status: DC

## 2014-03-20 MED ORDER — NEOMYCIN-POLYMYXIN-HC 3.5-10000-1 OT SUSP: 3.0000 [drp] | Freq: Four times a day (QID) | OTIC | Status: DC

## 2014-03-20 NOTE — Patient Instructions (Signed)
Get your blood work before you leave   Use drops x 5 days, call if no better   Please come back to the office in 6 months for a routine check up

## 2014-03-20 NOTE — Assessment & Plan Note (Addendum)
See previous entry, she tried  fluoxetine for one week, developed nausea and headache. Decided not to take SSRIs at this point, she is gradually improving with counseling only. She will call at any time if she feels she needs medications.

## 2014-03-20 NOTE — Progress Notes (Signed)
Pre visit review using our clinic review tool, if applicable. No additional management support is needed unless otherwise documented below in the visit note. 

## 2014-03-20 NOTE — Progress Notes (Signed)
   Subjective:    Patient ID: Emily Ritter, female    DOB: 01-Apr-1976, 38 y.o.   MRN: [id number]  DOS:  03/20/2014 Type of visit - description : rov Interval history: Hypothyroidism, good medication compliance, would like  her TSH rechecked Anxiety, tried SSRIs for one week, developed some nausea and headaches, see assessment and plan 3 weeks history of very subtle left ear discomfort. Denies any ear discharge or bleeding. Hearing is normal, symptoms do not change when she chews   ROS Denies runny nose or sore throat. Mild, ill-defined left-sided sore throat  Past Medical History  Diagnosis Date  . Hypothyroidism   . Vitamin D deficiency   . Endometriosis   . Asthma   . Anxiety and depression   . Frequent headaches     Past Surgical History  Procedure Laterality Date  . Laparoscopic ovarian cystectomy  2002  . Diagnostic laparoscopy  1999    endometriosis  . Thyroid surgery  2004    lump removed    History   Social History  . Marital Status: Married    Spouse Name: N/A    Number of Children: 0  . Years of Education: N/A   Occupational History  . [employer]     Social History Main Topics  . Smoking status: Never Smoker   . Smokeless tobacco: Never Used  . Alcohol Use: No  . Drug Use: No  . Sexual Activity: Yes    Partners: Male    Birth Control/ Protection: None   Other Topics Concern  . Not on file   Social History Narrative   Original from [place], moved to [place] ~ 2005   Divorced, lives w/ a room mate         Medication List       This list is accurate as of: 03/20/14  8:10 PM.  Always use your most recent med list.               ERRIN 0.35 MG tablet  Generic drug:  norethindrone  Take 1 tablet by mouth daily.     levothyroxine 112 MCG tablet  Commonly known as:  SYNTHROID, LEVOTHROID  Take 1 tablet (112 mcg total) by mouth daily.     multivitamin tablet  Take 1 tablet by mouth daily.     neomycin-polymyxin-hydrocortisone 3.5-10000-1  otic suspension  Commonly known as:  CORTISPORIN  Place 3 drops into the left ear 4 (four) times daily. As directed           Objective:   Physical Exam BP 122/74  Pulse 68  Temp(Src) 98.8 F (37.1 C) (Oral)  Wt 123 lb 6 oz (55.963 kg)  SpO2 98%  LMP 02/15/2014 General -- alert, well-developed, NAD.  Neck --n  no LAD HEENT-- Not pale.  R Ear-- normal L ear--canal w/ minimal swelling, no red, no d/c, mild discomfort w/ otoscope insertion Throat symmetric, no redness or discharge.  Face symmetric, sinuses not tender to palpation. Nose not congested. TMJ-- no pain but + click on the R Neurologic--  alert & oriented X3. Speech normal, gait appropriate for age, strength symmetric and appropriate for age.  Psych-- Cognition and judgment appear intact. Cooperative with normal attention span and concentration. No anxious or depressed appearing.        Assessment & Plan:   Left otalgia, Very subtle left otitis externa?. Plan: Empiric eardrops, see instructions, if not better will need further eval

## 2014-03-20 NOTE — Assessment & Plan Note (Signed)
Last TSH is satisfactory, we'll continue with synthroid 112 mcg, check another TSH. If normal, next check up in 6 months

## 2014-03-21 ENCOUNTER — Telehealth: Payer: Self-pay | Admitting: *Deleted

## 2014-03-21 ENCOUNTER — Telehealth: Payer: Self-pay | Admitting: Internal Medicine

## 2014-03-21 NOTE — Telephone Encounter (Signed)
Caller name:  Transport planner.  Relation to pt: [facility] Call back number: [phone] Pharmacy: [facility]  Reason for call:  They do not have the neomycin-polymyxin-hydrocortisone (CORTISPORIN) 3.5-10000-1 otic suspension ; just the liquid.  Wants approval to change.  Call back.

## 2014-03-21 NOTE — Telephone Encounter (Signed)
Please see below, what other medication would you prefer?

## 2014-03-21 NOTE — Telephone Encounter (Signed)
Spoke with Patty at [facility], approved medication change.

## 2014-03-21 NOTE — Telephone Encounter (Signed)
Symptoms started less than 24 hours ago, needs conservative treatment first, we'll be happy to see her if she's not improving.

## 2014-03-21 NOTE — Telephone Encounter (Signed)
Pt came into office complaining of runny nose, headache, fever, productive couch that started over night.  I spoke with Dr. Larose Kells and advised the patient that per Larose Kells, she would likely not need an antibiotic.  I advised pt, per Larose Kells, for her to take Robitussin DM, Tylenol and drink plenty of fluids for a few days.  If not feeling better by Monday, call our office and make appointment to be seen.  I advised her if she felt worse over the weekend and needed to be seen, go to Urgent Care or ER.

## 2014-03-21 NOTE — Telephone Encounter (Signed)
Ok to use the liquid version, same sig

## 2014-03-28 ENCOUNTER — Ambulatory Visit (INDEPENDENT_AMBULATORY_CARE_PROVIDER_SITE_OTHER): Payer: BC Managed Care – PPO | Admitting: Obstetrics & Gynecology

## 2014-03-28 ENCOUNTER — Other Ambulatory Visit: Payer: Self-pay | Admitting: Obstetrics & Gynecology

## 2014-03-28 ENCOUNTER — Ambulatory Visit (INDEPENDENT_AMBULATORY_CARE_PROVIDER_SITE_OTHER): Payer: BC Managed Care – PPO

## 2014-03-28 VITALS — BP 123/72 | HR 78 | Temp 97.7°F | Wt 123.0 lb

## 2014-03-28 DIAGNOSIS — N801 Endometriosis of ovary: Secondary | ICD-10-CM

## 2014-03-28 DIAGNOSIS — D259 Leiomyoma of uterus, unspecified: Secondary | ICD-10-CM

## 2014-03-28 DIAGNOSIS — Z01419 Encounter for gynecological examination (general) (routine) without abnormal findings: Secondary | ICD-10-CM

## 2014-03-28 DIAGNOSIS — N83201 Unspecified ovarian cyst, right side: Secondary | ICD-10-CM

## 2014-03-28 DIAGNOSIS — N80129 Deep endometriosis of ovary, unspecified ovary: Secondary | ICD-10-CM

## 2014-03-28 DIAGNOSIS — N80109 Endometriosis of ovary, unspecified side, unspecified depth: Secondary | ICD-10-CM

## 2014-03-29 LAB — PAP IG AND HPV HIGH-RISK: HPV DNA HIGH RISK: NOT DETECTED

## 2014-03-29 LAB — HIV ANTIBODY (ROUTINE TESTING W REFLEX): HIV: NONREACTIVE

## 2014-03-29 LAB — RPR

## 2014-03-30 ENCOUNTER — Encounter: Payer: Self-pay | Admitting: Obstetrics & Gynecology

## 2014-03-30 NOTE — Progress Notes (Signed)
Subjective:     Emily Ritter is a 38 y.o. female here for a routine exam.      Personal health questionnaire:  Is patient Ashkenazi Jewish, have a family history of breast and/or ovarian cancer: no Is there a family history of uterine cancer diagnosed at age < 5, gastrointestinal cancer, urinary tract cancer, family member who is a Field seismologist syndrome-associated carrier: no Is the patient overweight and hypertensive, family history of diabetes, personal history of gestational diabetes or PCOS: no Is patient over 26, have PCOS,  family history of premature CHD under age 49, diabetes, smoke, have hypertension or peripheral artery disease:  no At any time, has a partner hit, kicked or otherwise hurt or frightened you?: no Over the past 2 weeks, have you felt down, depressed or hopeless?: no Over the past 2 weeks, have you felt little interest or pleasure in doing things?:no   Gynecologic History Patient's last menstrual period was 03/24/2014. Contraception: oral progesterone-only contraceptive Last Pap: 2012. Results were: normal   Obstetric History OB History  Gravida Para Term Preterm AB SAB TAB Ectopic Multiple Living  0 0 0 0 0 0 0 0 0 0         Past Medical History  Diagnosis Date  . Hypothyroidism   . Vitamin D deficiency   . Endometriosis   . Asthma   . Anxiety and depression   . Frequent headaches     Past Surgical History  Procedure Laterality Date  . Laparoscopic ovarian cystectomy  2002  . Diagnostic laparoscopy  1999    endometriosis  . Thyroid surgery  2004    lump removed    Current outpatient prescriptions:ERRIN 0.35 MG tablet, Take 1 tablet by mouth daily., Disp: , Rfl: ;  levothyroxine (SYNTHROID, LEVOTHROID) 112 MCG tablet, Take 1 tablet (112 mcg total) by mouth daily., Disp: 30 tablet, Rfl: 12;  Multiple Vitamin (MULTIVITAMIN) tablet, Take 1 tablet by mouth daily., Disp: , Rfl:  neomycin-polymyxin-hydrocortisone (CORTISPORIN) 3.5-10000-1 otic suspension, Place  3 drops into the left ear 4 (four) times daily. As directed, Disp: 10 mL, Rfl: 0 Allergies  Allergen Reactions  . Penicillins     History  Substance Use Topics  . Smoking status: Never Smoker   . Smokeless tobacco: Never Used  . Alcohol Use: No    Family History  Problem Relation Age of Onset  . Hypertension Mother   . Hypertension Father   . Heart disease Father     onset age 61, stent , CABG  . Uterine cancer Paternal Aunt   . Colon cancer Neg Hx   . Breast cancer Neg Hx       Review of Systems  Constitutional: negative for fatigue and weight loss Respiratory: negative for cough and wheezing Cardiovascular: negative for chest pain, fatigue and palpitations Gastrointestinal: negative for abdominal pain and change in bowel habits Musculoskeletal:negative for myalgias Neurological: negative for gait problems and tremors Behavioral/Psych: negative for abusive relationship, depression Endocrine: negative for temperature intolerance   Genitourinary:negative for abnormal menstrual periods, genital lesions, hot flashes, sexual problems and vaginal discharge Integument/breast: negative for breast lump, breast tenderness, nipple discharge and skin lesion(s)    Objective:       BP 123/72  Pulse 78  Temp(Src) 97.7 F (36.5 C)  Wt 55.792 kg (123 lb)  LMP 03/24/2014 General:   alert  Skin:   no rash or abnormalities  Lungs:   clear to auscultation bilaterally  Heart:   regular rate and rhythm, S1, S2  normal, no murmur, click, rub or gallop  Breasts:   normal without suspicious masses, skin or nipple changes or axillary nodes  Abdomen:  normal findings: no organomegaly, soft, non-tender and no hernia  Pelvis:  External genitalia: normal general appearance Urinary system: urethral meatus normal and bladder without fullness, nontender Vaginal: normal without tenderness, induration or masses Cervix: normal appearance Adnexa: normal bimanual exam Uterus: anteverted and  non-tender, normal size   Lab Review  Labs reviewed no Radiologic studies reviewed yes     Assessment:    Healthy female exam.  Endometriosis--symptoms controlled with minipill; stable, unilateral endometrioma   Plan:    Declines ovarian cystectomy Continue minipill   Orders Placed This Encounter  Procedures  . HIV antibody  . RPR    Follow up as needed or in 6 mths

## 2014-03-30 NOTE — Patient Instructions (Signed)

## 2014-05-09 ENCOUNTER — Ambulatory Visit (INDEPENDENT_AMBULATORY_CARE_PROVIDER_SITE_OTHER): Payer: BC Managed Care – PPO | Admitting: Internal Medicine

## 2014-05-09 ENCOUNTER — Encounter: Payer: Self-pay | Admitting: Internal Medicine

## 2014-05-09 VITALS — BP 120/79 | HR 62 | Temp 98.4°F | Ht 63.0 in | Wt 124.2 lb

## 2014-05-09 DIAGNOSIS — E039 Hypothyroidism, unspecified: Secondary | ICD-10-CM

## 2014-05-09 DIAGNOSIS — F419 Anxiety disorder, unspecified: Secondary | ICD-10-CM

## 2014-05-09 DIAGNOSIS — Z0000 Encounter for general adult medical examination without abnormal findings: Secondary | ICD-10-CM

## 2014-05-09 DIAGNOSIS — F329 Major depressive disorder, single episode, unspecified: Secondary | ICD-10-CM

## 2014-05-09 DIAGNOSIS — F32A Depression, unspecified: Secondary | ICD-10-CM

## 2014-05-09 NOTE — Progress Notes (Signed)
Subjective:    Patient ID: Emily Ritter, female    DOB: [birth year], 38 y.o.   MRN: [id number]  DOS:  05/09/2014 Type of visit - description : cpx Interval history: In the last few weeks has noted a lot of hair loss. Continue with brown spots in the face and often times small pustules on the scalp.   ROS Denies chest pain or difficulty breathing No nausea, vomiting, diarrhea or blood in the stools. No dysuria, gross hematuria difficulty urinating. Back by 03/27/2014 saw her gynecologist, was rx  Depo-Provera, she had persistent vaginal bleeding which is now better.   Past Medical History  Diagnosis Date  . Hypothyroidism   . Vitamin D deficiency   . Endometriosis   . Asthma   . Anxiety and depression   . Frequent headaches     Past Surgical History  Procedure Laterality Date  . Laparoscopic ovarian cystectomy  2002  . Diagnostic laparoscopy  1999    endometriosis  . Thyroid surgery  2004    lump removed    History   Social History  . Marital Status: Married    Spouse Name: N/A    Number of Children: 0  . Years of Education: N/A   Occupational History  . [employer]     Social History Main Topics  . Smoking status: Never Smoker   . Smokeless tobacco: Never Used  . Alcohol Use: No  . Drug Use: No  . Sexual Activity:    Partners: Male    Birth Control/ Protection: None   Other Topics Concern  . Not on file   Social History Narrative   Original from [place], moved to [place] ~ 2005   Divorced, lives w/ a room mate      Family History  Problem Relation Age of Onset  . Hypertension Mother   . Hypertension Father   . Heart disease Father     onset age 19, stent , CABG  . Uterine cancer Paternal Aunt   . Colon cancer Neg Hx   . Breast cancer Neg Hx        Medication List       This list is accurate as of: 05/09/14 11:59 PM.  Always use your most recent med list.               levothyroxine 112 MCG tablet  Commonly known as:  SYNTHROID,  LEVOTHROID  Take 1 tablet (112 mcg total) by mouth daily.     multivitamin tablet  Take 1 tablet by mouth daily.           Objective:   Physical Exam BP 120/79 mmHg  Pulse 62  Temp(Src) 98.4 F (36.9 C) (Oral)  Ht 5\' 3"  (1.6 m)  Wt 124 lb 4 oz (56.359 kg)  BMI 22.02 kg/m2  SpO2 99%  LMP 03/16/2014  General -- alert, well-developed, NAD.  Neck --no thyromegaly , normal carotid pulse  HEENT-- Not pale.  Lungs -- normal respiratory effort, no intercostal retractions, no accessory muscle use, and normal breath sounds.  Heart-- normal rate, regular rhythm, no murmur.  Abdomen-- Not distended, good bowel sounds,soft, non-tender. Skin-- hair looks healthy, few hyperpigmented areas in the face, scalp with few postinflammatory hyperpigmented spots. No pustules  at this time Extremities-- no pretibial edema bilaterally  Neurologic--  alert & oriented X3. Speech normal, gait appropriate for age, strength symmetric and appropriate for age.  Psych-- Cognition and judgment appear intact. Cooperative with normal attention span and concentration.  No anxious or depressed appearing.       Assessment & Plan:

## 2014-05-09 NOTE — Assessment & Plan Note (Signed)
Stable, on no medications

## 2014-05-09 NOTE — Assessment & Plan Note (Signed)
Td~ 2008 Had a flu shot Had a Cscope ~ July 2014, dx w/ internal hemorrhoids DR Ardis Hughs Female care per gyn Refer to dermatology, Dr. Allyson Sabal for a variety of skin complaints, see physical exam Diet and exercise discussed Labs

## 2014-05-09 NOTE — Assessment & Plan Note (Signed)
Continue with medications, return to the office in 6-8 months, labs at that time

## 2014-05-09 NOTE — Patient Instructions (Signed)
Stop by the front desk and schedule labs to be done within few days (fasting)  Please come back to the office 6-8 months  for a routine check up

## 2014-05-09 NOTE — Progress Notes (Signed)
Pre visit review using our clinic review tool, if applicable. No additional management support is needed unless otherwise documented below in the visit note. 

## 2014-05-17 ENCOUNTER — Other Ambulatory Visit (INDEPENDENT_AMBULATORY_CARE_PROVIDER_SITE_OTHER): Payer: BC Managed Care – PPO

## 2014-05-17 DIAGNOSIS — Z0000 Encounter for general adult medical examination without abnormal findings: Secondary | ICD-10-CM

## 2014-05-17 LAB — LIPID PANEL
Cholesterol: 158 mg/dL (ref 0–200)
HDL: 39.6 mg/dL (ref >39.00)
LDL CALC: 103 mg/dL — AB (ref 0–99)
NONHDL: 118.4
Total CHOL/HDL Ratio: 4
Triglycerides: 77 mg/dL (ref 0.0–149.0)
VLDL: 15.4 mg/dL (ref 0.0–40.0)

## 2014-05-17 LAB — CBC WITH DIFFERENTIAL/PLATELET
BASOS ABS: 0 10*3/uL (ref 0.0–0.1)
Basophils Relative: 0.7 % (ref 0.0–3.0)
EOS PCT: 8.1 % — AB (ref 0.0–5.0)
Eosinophils Absolute: 0.4 10*3/uL (ref 0.0–0.7)
HEMATOCRIT: 38.9 % (ref 36.0–46.0)
Hemoglobin: 12.9 g/dL (ref 12.0–15.0)
LYMPHS ABS: 1.7 10*3/uL (ref 0.7–4.0)
Lymphocytes Relative: 35 % (ref 12.0–46.0)
MCHC: 33 g/dL (ref 30.0–36.0)
MCV: 89.7 fl (ref 78.0–100.0)
MONO ABS: 0.4 10*3/uL (ref 0.1–1.0)
MONOS PCT: 9.2 % (ref 3.0–12.0)
NEUTROS PCT: 47 % (ref 43.0–77.0)
Neutro Abs: 2.3 10*3/uL (ref 1.4–7.7)
Platelets: 377 10*3/uL (ref 150.0–400.0)
RBC: 4.34 Mil/uL (ref 3.87–5.11)
RDW: 13.4 % (ref 11.5–15.5)
WBC: 4.9 10*3/uL (ref 4.0–10.5)

## 2014-05-17 LAB — AST: AST: 18 U/L (ref 0–37)

## 2014-05-17 LAB — ALT: ALT: 17 U/L (ref 0–35)

## 2014-06-11 ENCOUNTER — Encounter: Payer: Self-pay | Admitting: *Deleted

## 2014-06-12 ENCOUNTER — Encounter: Payer: Self-pay | Admitting: Obstetrics & Gynecology

## 2014-08-28 ENCOUNTER — Ambulatory Visit (INDEPENDENT_AMBULATORY_CARE_PROVIDER_SITE_OTHER): Payer: BLUE CROSS/BLUE SHIELD | Admitting: Family Medicine

## 2014-08-28 VITALS — BP 100/68 | HR 98 | Temp 98.8°F | Resp 16 | Ht 62.25 in | Wt 122.8 lb

## 2014-08-28 DIAGNOSIS — A09 Infectious gastroenteritis and colitis, unspecified: Secondary | ICD-10-CM | POA: Diagnosis not present

## 2014-08-28 DIAGNOSIS — K529 Noninfective gastroenteritis and colitis, unspecified: Secondary | ICD-10-CM | POA: Diagnosis not present

## 2014-08-28 LAB — POCT URINALYSIS DIPSTICK
Glucose, UA: NEGATIVE
Ketones, UA: 40
Leukocytes, UA: NEGATIVE
Nitrite, UA: NEGATIVE
PH UA: 5.5
RBC UA: NEGATIVE
Spec Grav, UA: 1.025
UROBILINOGEN UA: 0.2

## 2014-08-28 LAB — POCT CBC
GRANULOCYTE PERCENT: 91.8 % — AB (ref 37–80)
HEMATOCRIT: 45.1 % (ref 37.7–47.9)
HEMOGLOBIN: 14.4 g/dL (ref 12.2–16.2)
Lymph, poc: 0.8 (ref 0.6–3.4)
MCH, POC: 28.5 pg (ref 27–31.2)
MCHC: 31.9 g/dL (ref 31.8–35.4)
MCV: 89.3 fL (ref 80–97)
MID (cbc): 0.2 (ref 0–0.9)
MPV: 6.3 fL (ref 0–99.8)
POC GRANULOCYTE: 10.4 — AB (ref 2–6.9)
POC LYMPH PERCENT: 6.7 %L — AB (ref 10–50)
POC MID %: 1.5 %M (ref 0–12)
Platelet Count, POC: 387 10*3/uL (ref 142–424)
RBC: 5.05 M/uL (ref 4.04–5.48)
RDW, POC: 15.2 %
WBC: 11.3 10*3/uL — AB (ref 4.6–10.2)

## 2014-08-28 LAB — POCT UA - MICROSCOPIC ONLY
Casts, Ur, LPF, POC: NEGATIVE
Crystals, Ur, HPF, POC: NEGATIVE
Yeast, UA: NEGATIVE

## 2014-08-28 LAB — COMPREHENSIVE METABOLIC PANEL
ALK PHOS: 66 U/L (ref 39–117)
ALT: 17 U/L (ref 0–35)
AST: 18 U/L (ref 0–37)
Albumin: 4.2 g/dL (ref 3.5–5.2)
BUN: 12 mg/dL (ref 6–23)
CALCIUM: 9.6 mg/dL (ref 8.4–10.5)
CO2: 25 mEq/L (ref 19–32)
Chloride: 103 mEq/L (ref 96–112)
Creat: 0.51 mg/dL (ref 0.50–1.10)
GLUCOSE: 74 mg/dL (ref 70–99)
POTASSIUM: 4.4 meq/L (ref 3.5–5.3)
SODIUM: 139 meq/L (ref 135–145)
Total Bilirubin: 1.3 mg/dL — ABNORMAL HIGH (ref 0.2–1.2)
Total Protein: 7.6 g/dL (ref 6.0–8.3)

## 2014-08-28 LAB — LIPASE: LIPASE: 10 U/L (ref 0–75)

## 2014-08-28 MED ORDER — ONDANSETRON 4 MG PO TBDP
8.0000 mg | ORAL_TABLET | Freq: Once | ORAL | Status: AC
Start: 2014-08-28 — End: 2014-08-28
  Administered 2014-08-28: 8 mg via ORAL

## 2014-08-28 MED ORDER — CIPROFLOXACIN HCL 500 MG PO TABS: 500.0000 mg | ORAL_TABLET | Freq: Two times a day (BID) | ORAL | Status: DC

## 2014-08-28 MED ORDER — ONDANSETRON 8 MG PO TBDP: 8.0000 mg | ORAL_TABLET | Freq: Three times a day (TID) | ORAL | Status: DC | PRN

## 2014-08-28 NOTE — Patient Instructions (Signed)
Dehydration, Adult Dehydration is when you lose more fluids from the body than you take in. Vital organs like the kidneys, brain, and heart cannot function without a proper amount of fluids and salt. Any loss of fluids from the body can cause dehydration.  CAUSES   Vomiting.  Diarrhea.  Excessive sweating.  Excessive urine output.  Fever. SYMPTOMS  Mild dehydration  Thirst.  Dry lips.  Slightly dry mouth. Moderate dehydration  Very dry mouth.  Sunken eyes.  Skin does not bounce back quickly when lightly pinched and released.  Dark urine and decreased urine production.  Decreased tear production.  Headache. Severe dehydration  Very dry mouth.  Extreme thirst.  Rapid, weak pulse (more than 100 beats per minute at rest).  Cold hands and feet.  Not able to sweat in spite of heat and temperature.  Rapid breathing.  Blue lips.  Confusion and lethargy.  Difficulty being awakened.  Minimal urine production.  No tears. DIAGNOSIS  Your caregiver will diagnose dehydration based on your symptoms and your exam. Blood and urine tests will help confirm the diagnosis. The diagnostic evaluation should also identify the cause of dehydration. TREATMENT  Treatment of mild or moderate dehydration can often be done at home by increasing the amount of fluids that you drink. It is best to drink small amounts of fluid more often. Drinking too much at one time can make vomiting worse. Refer to the home care instructions below. Severe dehydration needs to be treated at the hospital where you will probably be given intravenous (IV) fluids that contain water and electrolytes. HOME CARE INSTRUCTIONS   Ask your caregiver about specific rehydration instructions.  Drink enough fluids to keep your urine clear or pale yellow.  Drink small amounts frequently if you have nausea and vomiting.  Eat as you normally do.  Avoid:  Foods or drinks high in sugar.  Carbonated  drinks.  Juice.  Extremely hot or cold fluids.  Drinks with caffeine.  Fatty, greasy foods.  Alcohol.  Tobacco.  Overeating.  Gelatin desserts.  Wash your hands well to avoid spreading bacteria and viruses.  Only take over-the-counter or prescription medicines for pain, discomfort, or fever as directed by your caregiver.  Ask your caregiver if you should continue all prescribed and over-the-counter medicines.  Keep all follow-up appointments with your caregiver. SEEK MEDICAL CARE IF:  You have abdominal pain and it increases or stays in one area (localizes).  You have a rash, stiff neck, or severe headache.  You are irritable, sleepy, or difficult to awaken.  You are weak, dizzy, or extremely thirsty. SEEK IMMEDIATE MEDICAL CARE IF:   You are unable to keep fluids down or you get worse despite treatment.  You have frequent episodes of vomiting or diarrhea.  You have blood or green matter (bile) in your vomit.  You have blood in your stool or your stool looks black and tarry.  You have not urinated in 6 to 8 hours, or you have only urinated a small amount of very dark urine.  You have a fever.  You faint. MAKE SURE YOU:   Understand these instructions.  Will watch your condition.  Will get help right away if you are not doing well or get worse. Document Released: 06/01/2005 Document Revised: 08/24/2011 Document Reviewed: 01/19/2011 Exit[employer] Patient Information 2015 [employer]. This information is not intended to replace advice given to you by your health care provider. Make sure you discuss any questions you have with your health care   provider. Deshidratacin, Adulto (Dehydration, Adult) Se denomina deshidratacin cuando se pierden ms lquidos de los que se ingieren. Los rganos [employer] riones, el cerebro y el corazn no pueden funcionar sin la [place] cantidad de lquidos y Press photographer. Cualquier prdida de lquidos del organismo puede causar  deshidratacin.  CAUSAS  Vmitos.  Diarrea.  Sudoracin excesiva.  Eliminacin excesiva de orina.  Cristy Hilts. SNTOMAS Deshidratacin leve  Sed.  Labios resecos.  Sequedad leve de la mucosa bucal. Deshidratacin moderada   La boca est muy seca.  Ojos hundidos.  La piel no vuelve rpidamente a su lugar cuando se suelta luego de pellizcarla ligeramente.  Elmon Else y disminucin de la produccin de [place].  Disminucin de la cantidad de lgrimas.  Dolor de [place]. Deshidratacin grave  La boca est muy seca.  Sed extrema.  Pulso rpido y dbil (ms de 100 por minuto en reposo).  Manos y pies fros.  Prdida de la capacidad para transpirar, independientemente del calor y la temperatura.  Respiracin rpida.  Labios azulados.  Confusin y Statistician.  Dificultad para despertarse.  Poca produccin de [place].  Falta de lgrimas. DIAGNSTICO El profesional diagnosticar deshidratacin basndose en los sntomas y el examen fsico. Las pruebas de sangre y [place] ayudarn a Firefighter el diagnstico. La evaluacin diagnstica suele identificar tambin las causas de la deshidratacin. [place] deshidratacin leve o moderada generalmente puede hacerse en el hogar mediante el aumento de la cantidad de los lquidos que se beben. Es mejor beber pequeas cantidades de lquidos con mayor frecuencia. Beber demasiado de una sola vez puede hacer que el vmito empeore. Siga las instrucciones para el cuidado en el hogar que se indican a continuacin.  La deshidratacin grave debe tratarse en el hospital en el que probablemente le administren lquidos por va intravenosa (IV) que contienen agua y Brewing technologist.  INSTRUCCIONES PARA EL CUIDADO DOMICILIARIO  Pida instrucciones especficas a su mdico con respecto a la rehidratacin.  Debe ingerir gran cantidad de lquido para mantener la orina de tono claro o color amarillo plido.  Beba pequeas  cantidades con frecuencia si tiene nuseas y vmitos.  Alimntese como lo hace normalmente.  Evite:  Alimentos o bebidas que Animal nutritionist.  Gaseosas.  Jugos.  Lquidos muy calientes o fros.  Bebidas con cafena.  Alimentos muy grasos.  Alcohol.  [place]on Asters demasiado a la vez.  Postres de gelatina.  Lave bien sus manos para evitar las bacterias o los virus se diseminen.  Slo tome medicamentos de venta libre o recetados para Glass blower/designer, las molestias o bajar la fiebre segn las indicaciones de su mdico.  Consulte a su mdico si puede seguir tomando todos sus medicamentos recetados o de Radio broadcast assistant.  Concurra puntualmente a las citas de control con el mdico. SOLICITE ATENCIN MDICA SI:  Tiene dolor abdominal y este aumenta o permanece en un rea (se localiza).  Aparece una erupcin, rigidez en el cuello o dolor de cabeza intensos.  Est irritable, somnoliento o le cuesta despertarse.  Se siente dbil, mareado tiene mucha sed. SOLICITE ATENCIN MDICA DE INMEDIATO SI:  No puede retener lquidos o empeora a pesar del tratamiento.  Tiene episodios frecuentes de vmitos o diarrea.  Observa sangre o una sustancia verde (bilis) en el vmito.  Lollie Marrow en la materia fecal, o las heces son negras y de aspecto alquitranado.  No ha orinado durante 6 a 8 horas, o slo ha [place] cantidad [place] de [place].  Tiene  fiebre.  Se desmaya. ASEGRESE QUE:  Comprende estas instrucciones.  Controlar su enfermedad.  Solicitar ayuda inmediatamente si no mejora o si empeora. Document Released: 06/01/2005 Document Revised: 08/24/2011 [facility] Patient Information 2015 [place]. This information is not intended to replace advice given to you by your health care provider. Make sure you discuss any questions you have with your health care provider.   Viral Gastroenteritis Viral gastroenteritis is also known as stomach flu. This  condition affects the stomach and intestinal tract. It can cause sudden diarrhea and vomiting. The illness typically lasts 3 to 8 days. Most people develop an immune response that eventually gets rid of the virus. While this natural response develops, the virus can make you quite ill. CAUSES  Many different viruses can cause gastroenteritis, such as rotavirus or noroviruses. You can catch one of these viruses by consuming contaminated food or water. You may also catch a virus by sharing utensils or other personal items with an infected person or by touching a contaminated surface. SYMPTOMS  The most common symptoms are diarrhea and vomiting. These problems can cause a severe loss of body fluids (dehydration) and a body salt (electrolyte) imbalance. Other symptoms may include:  Fever.  Headache.  Fatigue.  Abdominal pain. DIAGNOSIS  Your caregiver can usually diagnose viral gastroenteritis based on your symptoms and a physical exam. A stool sample may also be taken to test for the presence of viruses or other infections. TREATMENT  This illness typically goes away on its own. Treatments are aimed at rehydration. The most serious cases of viral gastroenteritis involve vomiting so severely that you are not able to keep fluids down. In these cases, fluids must be given through an intravenous line (IV). HOME CARE INSTRUCTIONS   Drink enough fluids to keep your urine clear or pale yellow. Drink small amounts of fluids frequently and increase the amounts as tolerated.  Ask your caregiver for specific rehydration instructions.  Avoid:  Foods high in sugar.  Alcohol.  Carbonated drinks.  Tobacco.  Juice.  Caffeine drinks.  Extremely hot or cold fluids.  Fatty, greasy foods.  Too much intake of anything at one time.  Dairy products until 24 to 48 hours after diarrhea stops.  You may consume probiotics. Probiotics are active cultures of beneficial bacteria. They may lessen the  amount and number of diarrheal stools in adults. Probiotics can be found in yogurt with active cultures and in supplements.  Wash your hands well to avoid spreading the virus.  Only take over-the-counter or prescription medicines for pain, discomfort, or fever as directed by your caregiver. Do not give aspirin to children. Antidiarrheal medicines are not recommended.  Ask your caregiver if you should continue to take your regular prescribed and over-the-counter medicines.  Keep all follow-up appointments as directed by your caregiver. SEEK IMMEDIATE MEDICAL CARE IF:   You are unable to keep fluids down.  You do not urinate at least once every 6 to 8 hours.  You develop shortness of breath.  You notice blood in your stool or vomit. This may look like coffee grounds.  You have abdominal pain that increases or is concentrated in one small area (localized).  You have persistent vomiting or diarrhea.  You have a fever.  The patient is a child younger than 3 months, and he or she has a fever.  The patient is a child older than 3 months, and he or she has a fever and persistent symptoms.  The patient is a child  older than 3 months, and he or she has a fever and symptoms suddenly get worse.  The patient is a baby, and he or she has no tears when crying. MAKE SURE YOU:   Understand these instructions.  Will watch your condition.  Will get help right away if you are not doing well or get worse. Document Released: 06/01/2005 Document Revised: 08/24/2011 Document Reviewed: 03/18/2011 [facility] Patient Information 2015 [place], [place]. This information is not intended to replace advice given to you by your health care provider. Make sure you discuss any questions you have with your health care provider.

## 2014-08-28 NOTE — Progress Notes (Signed)
Subjective:    Patient ID: Emily Ritter, female    DOB: 26-Jul-1975, 39 y.o.   MRN: [id number] This chart was scribed for Delman Cheadle, MD by Zola Button, Medical Scribe. This patient was seen in Room 3 and the patient's care was started at 1:34 PM.  Chief Complaint  Patient presents with  . Nausea    since 10 pm last night  . Emesis  . Diarrhea  . Abdominal Pain     HPI HPI Comments: Emily Ritter is a 39 y.o. female with a hx of constipation problems who presents to the [facility] complaining of gradual onset LUQ abdominal pain that started last night. Patient also reports having nausea, vomiting, watery diarrhea, lightheadedness, dizziness, chills, near-syncope, generalized weakness, and decreased and dark urine. She has been unable to tolerate foods and liquids. Her vomit is noted to be with food, water and bilious content. Her boyfriend has also been sick with similar symptoms; he has been improving. Patient denies vaginal discharge, dysuria, menstrual problems, and blood in her stool. She also denies possibility of pregnancy. She has had abdominal surgery in the past for ovarian cyst.   Past Medical History  Diagnosis Date  . Hypothyroidism   . Vitamin D deficiency   . Endometriosis   . Asthma   . Anxiety and depression   . Frequent headaches   . Anxiety    Current Outpatient Prescriptions on File Prior to Visit  Medication Sig Dispense Refill  . levothyroxine (SYNTHROID, LEVOTHROID) 112 MCG tablet Take 1 tablet (112 mcg total) by mouth daily. 30 tablet 12  . Multiple Vitamin (MULTIVITAMIN) tablet Take 1 tablet by mouth daily.     No current facility-administered medications on file prior to visit.   Allergies  Allergen Reactions  . Penicillins   . Amoxicillin Rash    Review of Systems  Constitutional: Positive for chills.  Gastrointestinal: Positive for nausea, vomiting, abdominal pain and diarrhea. Negative for blood in stool.  Genitourinary:  Positive for decreased urine volume. Negative for dysuria, vaginal discharge and menstrual problem.  Neurological: Positive for dizziness, weakness and light-headedness.       Objective:  BP 100/68 mmHg  Pulse 98  Temp(Src) 98.8 F (37.1 C) (Oral)  Resp 16  Ht 5' 2.25" (1.581 m)  Wt 122 lb 12.8 oz (55.702 kg)  BMI 22.28 kg/m2  SpO2 97%  LMP 05/24/2014  Physical Exam  Constitutional: She is oriented to person, place, and time. She appears well-developed and well-nourished. No distress.  HENT:  Head: Normocephalic and atraumatic.  Mouth/Throat: Oropharynx is clear and moist. No oropharyngeal exudate.  Eyes: Pupils are equal, round, and reactive to light.  Neck: Neck supple.  Cardiovascular: Normal rate, regular rhythm, S1 normal, S2 normal and normal heart sounds.   No murmur heard. Pulmonary/Chest: Effort normal and breath sounds normal. No respiratory distress. She has no wheezes. She has no rales.  Abdominal: Soft. She exhibits no distension. There is no hepatosplenomegaly. There is tenderness. There is no rebound, no guarding, no CVA tenderness and negative Murphy's sign.  Generalized TTP. Hyperactive tympanitic bowel sounds. No organomegaly.  Musculoskeletal: She exhibits no edema.  Neurological: She is alert and oriented to person, place, and time. No cranial nerve deficit.  Skin: Skin is warm and dry. No rash noted.  Psychiatric: She has a normal mood and affect. Her behavior is normal.  Nursing note and vitals reviewed.         Assessment & Plan:  Gastroenteritis, infectious, presumed - Plan: ondansetron (ZOFRAN-ODT) disintegrating tablet 8 mg, POCT CBC, Comprehensive metabolic panel, Lipase, POCT urinalysis dipstick, POCT UA - Microscopic Only  Gastroenteritis, acute - Plan: ondansetron (ZOFRAN-ODT) disintegrating tablet 8 mg, POCT CBC, Comprehensive metabolic panel, Lipase, POCT urinalysis dipstick, POCT UA - Microscopic Only Push fluids, rtc if sxs persist after  2-3d.  Meds ordered this encounter  Medications  . ondansetron (ZOFRAN ODT) 8 MG disintegrating tablet    Sig: Take 1 tablet (8 mg total) by mouth every 8 (eight) hours as needed for nausea or vomiting.    Dispense:  30 tablet    Refill:  0  . ondansetron (ZOFRAN-ODT) disintegrating tablet 8 mg    Sig:   . ciprofloxacin (CIPRO) 500 MG tablet    Sig: Take 1 tablet (500 mg total) by mouth 2 (two) times daily.    Dispense:  10 tablet    Refill:  0    I personally performed the services described in this documentation, which was scribed in my presence. The recorded information has been reviewed and considered, and addended by me as needed.  Delman Cheadle, MD MPH   Results for orders placed or performed in visit on 08/28/14  Comprehensive metabolic panel  Result Value Ref Range   Sodium 139 135 - 145 mEq/L   Potassium 4.4 3.5 - 5.3 mEq/L   Chloride 103 96 - 112 mEq/L   CO2 25 19 - 32 mEq/L   Glucose, Bld 74 70 - 99 mg/dL   BUN 12 6 - 23 mg/dL   Creat 0.51 0.50 - 1.10 mg/dL   Total Bilirubin 1.3 (H) 0.2 - 1.2 mg/dL   Alkaline Phosphatase 66 39 - 117 U/L   AST 18 0 - 37 U/L   ALT 17 0 - 35 U/L   Total Protein 7.6 6.0 - 8.3 g/dL   Albumin 4.2 3.5 - 5.2 g/dL   Calcium 9.6 8.4 - 10.5 mg/dL  Lipase  Result Value Ref Range   Lipase 10 0 - 75 U/L  POCT CBC  Result Value Ref Range   WBC 11.3 (A) 4.6 - 10.2 K/uL   Lymph, poc 0.8 0.6 - 3.4   POC LYMPH PERCENT 6.7 (A) 10 - 50 %L   MID (cbc) 0.2 0 - 0.9   POC MID % 1.5 0 - 12 %M   POC Granulocyte 10.4 (A) 2 - 6.9   Granulocyte percent 91.8 (A) 37 - 80 %G   RBC 5.05 4.04 - 5.48 M/uL   Hemoglobin 14.4 12.2 - 16.2 g/dL   HCT, POC 45.1 37.7 - 47.9 %   MCV 89.3 80 - 97 fL   MCH, POC 28.5 27 - 31.2 pg   MCHC 31.9 31.8 - 35.4 g/dL   RDW, POC 15.2 %   Platelet Count, POC 387 142 - 424 K/uL   MPV 6.3 0 - 99.8 fL  POCT urinalysis dipstick  Result Value Ref Range   Color, UA dk yellow    Clarity, UA cloudy    Glucose, UA neg    Bilirubin,  UA small    Ketones, UA 40    Spec Grav, UA 1.025    Blood, UA neg    pH, UA 5.5    Protein, UA trace    Urobilinogen, UA 0.2    Nitrite, UA neg    Leukocytes, UA Negative   POCT UA - Microscopic Only  Result Value Ref Range   WBC, Ur, HPF, POC 1-3  RBC, urine, microscopic 0-1    Bacteria, U Microscopic trace    Mucus, UA large    Epithelial cells, urine per micros 6-12    Crystals, Ur, HPF, POC neg    Casts, Ur, LPF, POC neg    Yeast, UA neg

## 2014-09-19 ENCOUNTER — Other Ambulatory Visit: Payer: Self-pay

## 2014-09-25 ENCOUNTER — Encounter: Payer: Self-pay | Admitting: Family Medicine

## 2014-10-03 ENCOUNTER — Ambulatory Visit: Payer: BC Managed Care – PPO | Admitting: Obstetrics & Gynecology

## 2014-11-07 ENCOUNTER — Ambulatory Visit (INDEPENDENT_AMBULATORY_CARE_PROVIDER_SITE_OTHER): Payer: BLUE CROSS/BLUE SHIELD | Admitting: Internal Medicine

## 2014-11-07 ENCOUNTER — Other Ambulatory Visit: Payer: Self-pay

## 2014-11-07 ENCOUNTER — Encounter: Payer: Self-pay | Admitting: Internal Medicine

## 2014-11-07 VITALS — BP 108/62 | HR 75 | Temp 98.0°F | Ht 62.0 in | Wt 125.0 lb

## 2014-11-07 DIAGNOSIS — L719 Rosacea, unspecified: Secondary | ICD-10-CM | POA: Diagnosis not present

## 2014-11-07 DIAGNOSIS — E039 Hypothyroidism, unspecified: Secondary | ICD-10-CM | POA: Diagnosis not present

## 2014-11-07 LAB — TSH: TSH: 0.84 u[IU]/mL (ref 0.35–4.50)

## 2014-11-07 MED ORDER — CLINDAMYCIN PHOSPHATE 1 % EX GEL: Freq: Two times a day (BID) | CUTANEOUS | Status: DC

## 2014-11-07 MED ORDER — LEVOTHYROXINE SODIUM 112 MCG PO TABS: 112.0000 ug | ORAL_TABLET | Freq: Every day | ORAL | Status: DC

## 2014-11-07 NOTE — Assessment & Plan Note (Signed)
Good compliance with medications, continue Synthroid, labs

## 2014-11-07 NOTE — Assessment & Plan Note (Addendum)
Recently saw a dermatologist in [place] for a facial rash, diagnosed with rosacea (previously diagnosed with ocular rosacea by a local ophthalmologist in [place]). About 3 weeks ago started and abx call limeclicine (not available in the [place]) and topical creams, name of the cream? She is doing very well. Plan: Finish po abx once she ran out of her topical creams, start using clindamycin gel.

## 2014-11-07 NOTE — Patient Instructions (Signed)
Please go to the lab  Once you finish the creams  you have, start using clindamycin for rosacea

## 2014-11-07 NOTE — Progress Notes (Signed)
Pre visit review using our clinic review tool, if applicable. No additional management support is needed unless otherwise documented below in the visit note. 

## 2014-11-07 NOTE — Progress Notes (Signed)
   Subjective:    Patient ID: Emily Ritter, female    DOB: 11-Jul-1975, 39 y.o.   MRN: [id number]  DOS:  11/07/2014 Type of visit - description : rov Interval history: Hypothyroidism, good compliance of medication. Rosacea, patient went to [place] her native country and saw a Paediatric nurse, diagnosed with rosacea, doing better   Review of Systems Denies nausea, vomiting, diarrhea She is eating healthier, exercising more, at some point she had fatigue, headaches and dyspepsia: Doing better  Past Medical History  Diagnosis Date  . Hypothyroidism   . Vitamin D deficiency   . Endometriosis   . Asthma   . Anxiety and depression   . Frequent headaches   . Anxiety     Past Surgical History  Procedure Laterality Date  . Laparoscopic ovarian cystectomy  2002  . Diagnostic laparoscopy  1999    endometriosis  . Thyroid surgery  2004    lump removed    History   Social History  . Marital Status: Single    Spouse Name: N/A  . Number of Children: 0  . Years of Education: N/A   Occupational History  . [employer]     Social History Main Topics  . Smoking status: Never Smoker   . Smokeless tobacco: Never Used  . Alcohol Use: No  . Drug Use: No  . Sexual Activity:    Partners: Male    Birth Control/ Protection: None   Other Topics Concern  . Not on file   Social History Narrative   Original from [place], moved to [place] ~ 2005   Divorced, lives w/ a room mate         Medication List       This list is accurate as of: 11/07/14 11:59 PM.  Always use your most recent med list.               clindamycin 1 % gel  Commonly known as:  CLINDAGEL  Apply topically 2 (two) times daily.     levothyroxine 112 MCG tablet  Commonly known as:  SYNTHROID, LEVOTHROID  Take 1 tablet (112 mcg total) by mouth daily.     multivitamin tablet  Take 1 tablet by mouth daily.           Objective:   Physical Exam BP 108/62 mmHg  Pulse 75  Temp(Src) 98 F (36.7 C) (Oral)  Ht  5\' 2"  (1.575 m)  Wt 125 lb (56.7 kg)  BMI 22.86 kg/m2  SpO2 95%  LMP 10/03/2014  General:   Well developed, well nourished . NAD.  HEENT:  Normocephalic . Face symmetric, atraumatic Lungs:  CTA B Normal respiratory effort, no intercostal retractions, no accessory muscle use. Heart: RRR,  no murmur.  no pretibial edema bilaterally   Skin: Facial skin is covered by light makeup, exam limited, no pustules and very mild malar erythema. Neurologic:  alert & oriented X3.  Speech normal, gait appropriate for age and unassisted Psych--  Cognition and judgment appear intact.  Cooperative with normal attention span and concentration.  Behavior appropriate. No anxious or depressed appearing.      Assessment & Plan:    In general feeling well, eating healthier, previously had headaches, mild fatigue and dyspepsia, all better. She's also avoiding gluten which has help with the dyspepsia

## 2015-02-08 ENCOUNTER — Ambulatory Visit (INDEPENDENT_AMBULATORY_CARE_PROVIDER_SITE_OTHER): Payer: BLUE CROSS/BLUE SHIELD | Admitting: Gynecology

## 2015-02-08 ENCOUNTER — Encounter: Payer: Self-pay | Admitting: Gynecology

## 2015-02-08 VITALS — BP 126/80 | Ht 63.0 in | Wt 123.0 lb

## 2015-02-08 DIAGNOSIS — Z8742 Personal history of other diseases of the female genital tract: Secondary | ICD-10-CM

## 2015-02-08 DIAGNOSIS — Z8639 Personal history of other endocrine, nutritional and metabolic disease: Secondary | ICD-10-CM | POA: Insufficient documentation

## 2015-02-08 DIAGNOSIS — R102 Pelvic and perineal pain: Secondary | ICD-10-CM | POA: Diagnosis not present

## 2015-02-08 LAB — URINALYSIS W MICROSCOPIC + REFLEX CULTURE
BILIRUBIN URINE: NEGATIVE
Casts: NONE SEEN [LPF]
Crystals: NONE SEEN [HPF]
GLUCOSE, UA: NEGATIVE
KETONES UR: NEGATIVE
Leukocytes, UA: NEGATIVE
NITRITE: NEGATIVE
PH: 6.5 (ref 5.0–8.0)
PROTEIN: NEGATIVE
Specific Gravity, Urine: 1.02 (ref 1.001–1.035)
Yeast: NONE SEEN [HPF]

## 2015-02-08 MED ORDER — KETOROLAC TROMETHAMINE 30 MG/ML IJ SOLN
30.0000 mg | Freq: Once | INTRAMUSCULAR | Status: AC
  Administered 2015-02-08: 30 mg via INTRAMUSCULAR

## 2015-02-08 MED ORDER — KETOROLAC TROMETHAMINE 10 MG PO TABS: 10.0000 mg | ORAL_TABLET | Freq: Four times a day (QID) | ORAL | Status: DC | PRN

## 2015-02-08 NOTE — Addendum Note (Signed)
Addended by: Thurnell Garbe A on: 02/08/2015 02:54 PM   Modules accepted: Orders

## 2015-02-08 NOTE — Progress Notes (Signed)
Patient is a 39 year old gravida 0 with past history of endometriosis (bilateral endometriomas) with multiple surgical procedures to include the first and second were laparoscopic and the third require open laparotomy to remove a large cyst by another provider. Patient complaining of right lower quadrant and right side pain for the past few days. She denies any dysuria, frequency, fever, chills, nausea, or vomiting. The last time I had seen in the office was on 02/20/2011 in my note is as follows:  "Record and indicated that in 1999 she had a diagnostic laparoscopy and was diagnosed with endometriosis of the right ovary in [place]. In 2002 she had another laparoscopy and had a laparoscopic left ovary and cystectomy also in [place]. She suffers from dyspareunia especially the right lower abdomen during intercourse at first she stated it did not hurt any other times of the month but now it's more frequent. She also complains of brownish discharge in between her periods. She is now using any form of contraception. Menstrual cycles report to be normal 35 day duration. Patient has a history of hypothyroidism for which she is on Synthroid 100 mcg daily. She's had history vitamin D deficiency in the past and is currently taking vitamin D 3 2000 units daily.  Patient had an ultrasound in the office back in December of 2010 she has 5 small fibroids and had a left ovarian thinwall vascular complex mass measuring 48 x 40 x 43 mm consistent with endometrioma and slightly increased over the course of 2 years.Followup ultrasound today and sonohysterogram: Uterus measured 8.9 x 6.0 x 4.4 cm with an endometrial stripe of 7.9 mm for small fibroids were noted largest one measuring 23 x 16 mm slightly indenting the uterine cavity. Right ovary was normal the left ovary complex thin-walled avascular cysts once again was noted measure 47 x 37 mm. Sonohysterogram with no evidence of intracavitary  defects.  We had a discussion today about the findings on ultrasound and her symptomatology and would recommend proceeding with a diagnostic laparoscopy with left ovarian cystectomy possible left oophorectomy possible left salpingo-oophorectomy. We'll plan on concurrently doing a chromopertubation."  The patient did not follow-up with me and saw another provider until now. She had an ultrasound by her other provider in October and the only description from the ultrasound was that she had a large endometrioma on the left ovary and several small fibroids.  Exam: Blood pressure 126/80   with 123 pounds   BMI 21.79 kg/m Gen. appearance well-developed well-nourished female complaining of right-sided and low abdominal pain with some mild back discomfort. Back: No CVA tenderness Abdomen: Soft tenderness in the right lower quadrant no rebound or guarding Pelvic exam Bartholin urethra Skene was within normal limits Vagina: No lesions or discharge Cervix: No lesions or discharge positive cervical motion tenderness Uterus: Anteverted Right adnexa: Tenderness and fullness in the right Left adnexa nontender no rebound or guarding no discernible mass Rectovaginal exam 10 or to the right lower quadrant but no masses palpated  Assessment/plan: Patient recently completed her last menstrual cycle. Patient with history of endometriosis and bilateral endometriomas with multiple surgeries in the past. She was provided with Toradol 30 mg IM today and it by mouth prescription for 10 mg every 6 hours for the next 4-5 days to take when necessary. She will return back to the office next week for pelvic ultrasound. Literature information on laparoscopy, ovarian cyst, Toradol, and endometriosis was provided. We'll make an assessment after the ultrasound and  plan on some form of definitive surgery.

## 2015-02-08 NOTE — Patient Instructions (Signed)
Quiste ovrico (Ovarian Cyst) Un quiste ovrico es una bolsa llena de lquido que se forma en el ovario. Los ovarios son los rganos pequeos que producen vulos en las mujeres. Se pueden formar varios tipos de Levi Strauss. Benito Mccreedy no son cancerosos. Muchos de ellos no causan problemas y con frecuencia desaparecen solos. Algunos pueden provocar sntomas y requerir Clinical research associate. Los tipos ms comunes de quistes ovricos son los siguientes:  Quistes funcionales: estos quistes pueden aparecer todos los meses durante el ciclo menstrual. Esto es normal. Estos quistes suelen desaparecer con el prximo ciclo menstrual si la mujer no queda embarazada. En general, los quistes funcionales no tienen sntomas.  Endometriomas: estos quistes se forman a partir del tejido que recubre el tero. Tambin se denominan "quistes de chocolate" porque se llenan de sangre que se vuelve marrn. Este tipo de quiste puede Engineer, production en la zona inferior del abdomen durante la relacin sexual y con el perodo menstrual.  Cistoadenomas: este tipo se desarrolla a partir de las clulas que se [place] en el exterior del ovario. Estos quistes pueden ser muy grandes y causar dolor en la zona inferior del abdomen y durante la relacin sexual. Cindra Presume tipo de quiste puede girar sobre s mismo, cortar el suministro de Biochemist, clinical y causar un dolor intenso. Tambin se puede romper con facilidad y Stage manager.  Quistes dermoides: este tipo de quiste a veces se encuentra en ambos ovarios. Estos quistes pueden BJ's tipos de tejidos del organismo, como piel, dientes, pelo o Database administrator. Generalmente no tienen sntomas, a menos que sean muy grandes.  Quistes tecalutenicos: aparecen cuando se produce demasiada cantidad de cierta hormona (gonadotropina corinica humana) que estimula en exceso al ovario para que produzca vulos. Esto es ms frecuente despus de procedimientos que ayudan a la concepcin de un beb  (fertilizacin in vitro). CAUSAS   Los medicamentos para la fertilidad pueden provocar una afeccin mediante la cual se forman mltiples quistes de gran tamao en los ovarios. Esta se denomina sndrome de hiperestimulacin ovrica.  El sndrome del ovario poliqustico es una afeccin que puede causar desequilibrios hormonales, los cuales pueden dar como resultado quistes ovricos no funcionales. SIGNOS Y SNTOMAS  Muchos quistes ovricos no causan sntomas. Si se presentan sntomas, stos pueden ser:  Dolor o molestias en la pelvis.  Dolor en la parte baja del abdomen.  [place].  Aumento del permetro abdominal (hinchazn).  Perodos menstruales anormales.  Aumento del Rockwell [employer]ation perodos [place].  Cese de los perodos menstruales sin estar embarazada. DIAGNSTICO  Estos quistes se descubren comnmente durante un examen de rutina o una exploracin ginecolgica anual. Es posible que se ordenen otros estudios para obtener ms informacin sobre el [place]. Estos estudios pueden ser:  Engineer, materials.  Radiografas de la pelvis.  Tomografa computada.  Resonancia magntica.  Anlisis de [place]. TRATAMIENTO  Muchos de los quistes ovricos desaparecen por s solos, sin tratamiento. Es probable que el mdico quiera controlar el quiste regularmente durante 2 o 62mses para ver si se produce algn cambio. En el caso de las mujeres en la menopausia, es particularmente importante controlar de cerca al quiste ya que el ndice de cncer de ovario en las mujeres menopusicas es ms alto. Cuando se requiere tClinical research associate este puede incluir cualquiera de los siguientes:  Un procedimiento para drenar el quiste (aspiracin). Esto se puede realizar mFamily Dollar Storesuso de uGuamgrande y [place] Tambin se puede hacer a travs de un procedimiento laparoscpico, En  este procedimiento, se inserta un tubo delgado que emite luz y que tiene una pequea cmara en un  extremo (laparoscopio) a travs de una pequea incisin.  Ciruga para extirpar el quiste completo. Esto se puede realizar mediante una ciruga laparoscpica o Ardelia Mems ciruga abierta, la cual implica realizar una incisin ms grande en la parte inferior del abdomen.  Tratamiento hormonal o pldoras anticonceptivas. Estos mtodos a veces se usan para ayudar a Writer. [place] solo medicamentos de venta libre o recetados, segn las indicaciones del mdico.  Consulting civil engineer a las consultas de control con su mdico segn las indicaciones.  Hgase exmenes plvicos regulares y pruebas de [place]. SOLICITE ATENCIN MDICA SI:   Los perodos se atrasan, son irregulares, dolorosos o cesan.  El dolor plvico o abdominal no desaparece.  El abdomen se agranda o se hincha.  Siente presin en la vejiga o no puede vaciarla completamente.  Siente dolor durante las [employer]ot.  Tiene una sensacin de hinchazn, presin o Manufacturing systems engineer.  Pierde peso sin razn aparente.  Siente un Pharmacist, hospital.  Est estreida.  Pierde el apetito.  Le aparece acn.  Nota un aumento del vello corporal y facial.  Elenore Rota de peso sin hacer modificaciones en su actividad fsica y en su dieta habitual.  Sospecha que est embarazada. SOLICITE ATENCIN MDICA DE INMEDIATO SI:   Siente cada vez ms dolor abdominal.  Tiene malestar estomacal (nuseas) y vomita.  Tiene fiebre que se presenta de [place]er repentina.  Siente dolor abdominal al defecar.  Sus perodos menstruales son ms abundantes que lo habitual. ASEGRESE DE QUE:   Comprende estas instrucciones.  Controlar su afeccin.  Recibir ayuda de inmediato si no mejora o si empeora. Document Released: 03/11/2005 Document Revised: 06/06/2013 [facility] Patient Information 2015 [place]. This information is not intended to replace advice given to you by your health  care provider. Make sure you discuss any questions you have with your health care provider. Ketorolac tablets Qu es este medicamento? El QUETOROLAC es un medicamento antiinflamatorio no esteroideo (AINE). Se utiliza para tratar [employer] Energy leves a moderados a Control and instrumentation engineer, Geophysicist/field seismologist postoperatorio. No debe utilizarlo durante ms de 5 das. Este medicamento puede ser utilizado para otros usos; si tiene alguna pregunta consulte con su proveedor de atencin mdica o con su farmacutico. MARCAS COMERCIALES DISPONIBLES: Toradol Qu le debo informar a mi profesional de la salud antes de tomar este medicamento? Necesita saber si usted presenta alguno de los [employer] International o situaciones: -asma -problemas de sangrado, como hemofilia -fuma -consume ms de 3 bebidas alcohlicas por da -enfermedad cardiaca o problemas circulatorios, tales como insuficiencia cardiaca o edema de pierna (retencin de lquido) -alta presin sangunea -enfermedad renal -enfermedad heptica -lceras o sangrado estomacal -una reaccin alrgica o inusual al quetorolac, a la aspirina, a otros AINE, a otros medicamentos alimentos, colorantes o conservantes -si est embarazada o buscando quedar embarazada -si est amamantando a un beb Cmo debo utilizar este medicamento? Tome este medicamento por va oral con un vaso lleno de agua. Siga las instrucciones de la etiqueta del [place]. Tome sus dosis a intervalos regulares. No tome su medicamento con una frecuencia mayor a la indicada. No tome ms que la dosis recomendada. Su farmacutico le dar una [place] del medicamento especial con cada receta y relleno. Asegrese de leer esta informacin cada vez cuidadosamente. Hable con su pediatra para informarse acerca del uso de este medicamento en nios. Aunque  este medicamento ha sido recetado a nios tan menores como de 16 aos de edad para condiciones selectivas, las precauciones se aplican. Los pacientes de ms de  65 aos de edad pueden presentar reacciones ms fuertes y Designer, industrial/product dosis JPM[employer] Chase & Co. Sobredosis: Pngase en contacto inmediatamente con un centro toxicolgico o una sala de urgencia si usted cree que haya tomado demasiado medicamento. ATENCIN: ConAgra Foods es solo para usted. No comparta este medicamento con nadie. Qu sucede si me olvido de una dosis? Si olvida una dosis, tmela lo antes posible. Si es casi la hora de la prxima dosis, tome slo esa dosis. No tome dosis adicionales o dobles. Qu puede interactuar con este medicamento? No tome esta medicina con ninguno de los siguientes medicamentos: -aspirina y medicamentos tipo aspirina -cidofovir -metotrexato -los [place], medicamentos para Conservation officer, historic buildings o inflamacin, como ibuprofeno o naproxeno -pentoxifilina -probenecid Esta medicina tambin puede interactuar con los siguientes medicamentos: -alcohol -alendronato -alprazolam -carbamazepina -ciclosporina -diurticos -flavocoxid -fluoxetina -ginkgo -litio -medicamentos para la alta presin sangunea como enalapril -medicamentos que afectan las plaquetas como pentoxifilina -medicamentos que tratan o previenen cogulos sanguneos, como heparina, warfarina -relajantes musculares -fenitona -medicamentos esteroideos, como la prednisona o la cortisona -tiotixeno Puede ser que esta lista no menciona todas las posibles interacciones. Informe a su profesional de KB Home	[place] de AES Corporation productos a base de hierbas, medicamentos de [place] o suplementos nutritivos que est tomando. Si usted fuma, consume bebidas alcohlicas o si utiliza drogas ilegales, indqueselo tambin a su profesional de KB Home	[place]. Algunas sustancias pueden interactuar con su medicamento. A qu debo estar atento al usar Coca-Cola? Si el dolor no mejora, informe a su mdico o a su profesional de KB Home	[place]. Consulte con su mdico antes de tomar otros analgsicos. No se trate usted mismo. Este medicamento no  previene ataques cardiacos o derrames cerebrales. De hecho, este medicamento puede aumentar la posibilidad de Insurance risk surveyor un ataque cardiaco o un derrame cerebral. La posibilidad puede aumentar con el uso prolongado de este medicamento y en pacientes con enfermedad cardiaca. Si est tomando aspirina para la prevencin de ataques cardiacos o derrames cerebrales, comunquese con su mdico o su profesional de KB Home	[place]. Evite tomar medicamentos, tales como ibuprofeno y naproxeno con Fish farm manager. Es probable que se Pensions consultant secundarios, tales como molestias estomacales, nuseas o lceras. No debe de tomar Coca-Cola con muchos medicamentos disponibles de USG Corporation. Este medicamento puede provocar lceras y hemorragia del estmago e intestinos en cualquier momento durante tratamiento. No fume ni ingiera alcohol. Esto irrita an ms el estmago y puede hacerlo ms susceptible a dao por el uso de Coca-Cola. Pueden ocurrir lceras y hemorragia sin sntomas de Hydrographic surveyor y Futures trader la [place]. Puede experimentar mareos o somnolencia. No conduzca ni utilice maquinaria ni haga nada que Associate Professor en estado de alerta hasta que sepa cmo le afecta este medicamento. No se siente ni se ponga de pie con rapidez, especialmente si es un paciente de edad avanzada. Esto reduce el riesgo de mareos o Clorox Company. Este medicamento puede hacerle sangrar con mayor facilidad. Trate de no lastimarse los dientes y las encas al cepillarlos o limpiarlos con hilo dental. Qu efectos secundarios puedo tener al Masco Corporation este medicamento? Efectos secundarios que debe informar a su mdico o a Barrister's clerk de la salud tan pronto como sea posible: -reacciones alrgicas como erupcin cutnea, picazn o urticarias, hinchazn de la cara, labios o lengua -problemas respiratorios -alta presin sangunea -nuseas, vmito -enrojecimiento, formacin de ampollas, descamacin o  distensin de la piel, inclusive  dentro de la boca -dolor de Programmer, applications -signos y sntomas de sangrado tales como heces de color oscuro, con sangre o con aspecto alquitranado; orina roja o marrn oscura; escupir sangre o material marrn que parece granos de caf; puntos rojos en la piel; sangrado o magulladuras inusuales de los ojos, encas o Lawyer -signos y sntomas de un derrame cerebral tales como cambios en la visin; confesin; dificultad para hablar o entender; dolores de cabeza severos; entumecimiento o debilidad repentina de la cara, brazo o pierna; dificultad para andar; Tree surgeon; prdida del equilibrio o coordinacin -dificultad para orinar o cambios en el volumen de orina -aumento de [place] o hinchazn sin explicacin -cansancio o debilidad inusual -color amarillento de los ojos o la piel Efectos secundarios que, por lo general, no requieren Geophysical data processor (debe informarlos a su mdico o a Barrister's clerk de la salud si persisten o si son molestos): -diarrea -mareos -dolor de cabeza -Merchant navy officer Puede ser que esta lista no menciona todos los posibles efectos secundarios. Comunquese a su mdico por asesoramiento mdico Humana Inc. Usted puede informar los efectos secundarios a la FDA por telfono al 1-800-FDA-1088. Dnde debo guardar mi medicina? Mantngala fuera del alcance de los nios. Gurdela a FPL Group, entre 20 y 52 grados C (73 y 83 grados F). Deseche todo el medicamento que no haya utilizado, despus de la fecha de vencimiento. ATENCIN: Este folleto es un resumen. Puede ser que no cubra toda la posible informacin. Si usted tiene preguntas acerca de esta medicina, consulte con su mdico, su farmacutico o su profesional de Technical sales engineer.  2015, Elsevier/Gold Standard. (2013-02-15 15:57:34) Dficit de vitamina D  (Vitamin D Deficiency) La vitamina D es una vitamina importante necesaria para el organismo. Se denomina dficit cuando hay muy poca cantidad en el organismo. Un  dficit muy grave puede hacer que los [place] se ablanden y puede causar una enfermedad llamada raquitismo.  La vitamina D es importante para el organismo por diferentes razones, por ejemplo:   Ayuda en la absorcin de dos minerales llamados calcio y fsforo.  Ayuda a que los Principal Financial.  Puede prevenir algunas enfermedades, como la diabetes y la esclerosis mltiple.  Ayuda al funcionamiento de los msculos y Film/video editor. La vitamina D puede obtenerse de CarMax. Es Ardelia Mems parte natural de algunos alimentos. La vitamina se agrega tambin a algunos productos lcteos y cereales. Algunas personas toman suplementos de vitamina D. Adems, el organismo produce vitamina D cuando est al sol. Los rayos del sol hacen que el organismo produzca una forma de la vitamina que puede [place].  CAUSAS   No comer suficientes alimentos que contengan vitamina D.  No recibir suficiente Insurance claims handler.  Sufrir Actuary del sistema digestivo que hacen difcil la absorcin de vitamina D. Estas enfermedades incluyen la enfermedad de Crohn, pancreatitis crnica y la fibrosis [place].  Dillard [place] sido sometido a una ciruga en la que se elimina una parte del estmago o el intestino delgado.  Ser obeso. Las clulas de grasa eliminan la vitamina D de la [place]. Eso hace que las personas obesas no tengan suficiente vitamina D en la sangre y en los tejidos del organismo.  Enfermedades crnicas de los riones o del hgado. [place] riesgo son los que hacen ms probable el desarrollo del dficit de vitamina D. Ellos son:   El envejecimiento.  No poder salir mucho al exterior.  Vivir en un hogar de  ancianos.  Fracturas de huesos.  Debilidad de los huesos o huesos delgados (osteoporosis).  Sufrir una enfermedad o trastorno que modifica la forma en que el organismo absorbe la vitamina D.  Tener la piel oscura.  Algunos medicamentos como los medicamentos para las  convulsiones o los corticoides.  Tener sobrepeso o ser obeso. SNTOMAS  En los casos leves puede no haber sntomas. En los casos ms graves, los sntomas pueden ser:   [employer].  Dolor muscular.  Caerse con frecuencia.  Huesos rotos por lesiones menores, debido a la osteoporosis. DIAGNSTICO  Un anlisis de sangre es la mejor manera de diagnosticar el dficit de vitamina D.  TRATAMIENTO  El dficit de vitamina D puede tratarse de [place]. El tratamiento depende de la causa. Las opciones incluyen:   Tomar suplementos de vitamina D.  Tomar un suplemento de calcio. Su mdico le sugerir qu dosis es la mejor para usted. [place] los suplementos que su mdico le recete. [place]. Tome slo la cantidad sugerida.  Hgase un anlisis de [place] 2 meses despus de Art gallery manager a [employer].  Consuma alimentos que contengan vitamina D. Algunas buenas opciones son:  Productos lcteos cereales o jugos fortificados, . Fortificado significa que se ha aadido vitamina D a la comida. Revise la etiqueta del paquete para estar seguro.  Pescados grasos como el [place].  Huevos  Ostras.  No  utilice la cama de solar.  Mantenga su peso en un nivel saludable. Baje de peso si es necesario.  Cumpla con las visitas de control tal como se le indic. El mdico necesitar indicar anlisis de sangre para asegurarse que el dficit de vitamina D est mejorando. SOLICITE ATENCIN MDICA SI:   Tiene preguntas relacionadas con el tratamiento  Contina con los sntomas de dficit de vitamina D  Tiene nuseas o vmitos.  Est constipado.  Se siente confundido.  Siente dolor abdominal o dolor de espalda intenso. ASEGRESE DE QUE:   Comprende estas instrucciones.  Controlar su enfermedad.  Solicitar ayuda de inmediato si no mejora o si empeora. Document Released: 08/24/2011 Document  Revised: 09/26/2012 [facility] Patient Information 2015 [place]. This information is not intended to replace advice given to you by your health care provider. Make sure you discuss any questions you have with your health care provider. Laparoscopa diagnstica (Diagnostic Laparoscopy) La laparoscopa es un procedimiento quirrgico relativamente simple, de uso habitual y breve (menos de una hora) que se lleva a cabo para diagnosticar y tratar enfermedades del abdomen. El laparoscopio (tubo delgado, que emite luz, del tamao de un lpiz y similar a un telescopio) se inserta en el abdomen a travs de una pequea incisin (corte realizado por un cirujano). A travs de este instrumento, el profesional podr observar Constellation Energy rganos del interior del abdomen (vientre) y ver si hay algo anormal. La laparoscopa podr llevarse a cabo tanto en el hospital como en un consultorio. Podrn administrarle un sedante suave que lo ayudar a relajarse antes y durante el procedimiento. Una vez en la sala de operaciones, le administrarn una anestesia general (a menos que usted y el profesional elijan otro tipo de anestesia). Despus de la laparoscopa, que generalmente dura menos de Leone Brand, ser eBay sala de recuperacin durante algunas horas. Cuando regrese a Medical illustrator, la Hotel manager Apache Corporation y [place]. RIESGOS Y COMPLICACIONES Comparados con los beneficios, los riesgos de  la laparoscopia son relativamente pocos. El Management consultant con usted los riesgos antes del procedimiento. Algunos problemas que pueden ocurrir luego de la intervencin son:  Infecciones.  Hemorragias.  Puede ocurrir que se lesionen otros rganos.  Efectos secundarios de Architect. PROCEDIMIENTO Una vez que se encuentra anestesiado, el cirujano insufla el abdomen con un gas inofensivo (dixido de carbono) para Museum/gallery exhibitions officer observacin de los rganos de la pelvis. El cirujano introduce el  laparoscopio a travs de una pequea incisin en el ombligo o alrededor del mismo. Podr insertar otros instrumentos, como una sonda para mover los rganos o Optometrist algn procedimiento a travs de otra pequea incisin.  En ocasiones se toma una biopsia (muestra de tejido) para un diagnstico ms preciso o para Conservator, museum/gallery. La biopsia consiste en tomar una pequea muestra de tejido durante la laparoscopia para enviarlo al patlogo (especialista en la observacin de clulas y muestras de tejido) y que lo examine en el microscopio para un diagnstico a nivel de los tejidos. DESPUES DEL PROCEDIMIENTO  Se libera el gas del abdomen.  Las incisiones se cierran con puntos (suturas). Debido a que las incisiones son pequeas (generalmente de menos de 1 cm) las molestias son mnimas luego del procedimiento. Es posible que sienta cierto Loss adjuster, chartered. Es consecuencia de Programmer, systems del tubo mientras se encontraba dormido. Es posible que sienta algn dolor abdominal no muy intenso. Tambin podr sentir [employer]Linx en las incisiones realizadas para insertar los instrumentos en el abdomen.  El tiempo de recuperacin es reducido, siempre que no haya habido complicaciones.  Har reposo en la sala de recuperacin hasta que se encuentre estable y se sienta bien. Si no aparecen complicaciones, podr regresar a su casa. AVERIGE LOS RESULTADOS DE SU ANLISIS Durante su visita no contar con todos los Reynolds American. En este caso, tenga otra entrevista con su mdico para conocerlos. No piense que el resultado es normal si no tiene noticias de su mdico o de la institucin mdica. Es Building services engineer seguimiento de todos los [place] de [place]. INSTRUCCIONES [place] todos los medicamentos tal como se le indic.  Utilice los medicamentos de venta libre o de prescripcin para Conservation officer, historic buildings, Health and safety inspector o la [place], segn se lo indique el profesional que lo  asiste.  Reanude las actividades habituales cuando se le indique.  Es preferible que se duche y no tome baos de inmersin.  Reanude la actividad sexual luego de [place], o cuando lo autoricen.  No conduzca mientras se encuentre bajo los efectos de narcticos. SOLICITE ATENCIN MDICA SI:  Siente un dolor abdominal inexplicable.  Siente dolor en los hombros, en la regin de los tirantes.  Si se siente aturdido o siente que se va a Insurance account manager.  Siente escalofros.  Usted o su nio tienen una temperatura oral de ms de 38,9 C (102 F).  Observa un drenaje purulento (similar al pus) que proviene de alguna de las heridas.  Usted o su hijo no puede realizar movimientos intestinales o evacuar gases.  Usted o su hijo sufren nuseas o vmitos. EST SEGURO QUE:   Comprende las instrucciones para el alta mdica.  Controlar su enfermedad.  Solicitar atencin mdica de inmediato segn las indicaciones. Document Released: 06/01/2005 Document Revised: 08/24/2011 [facility] Patient Information 2015 [place]. This information is not intended to replace advice given to you by your health care provider. Make sure you discuss any questions you have with your health care provider. Endometriosis (  Endometriosis) La endometriosis es una enfermedad en la que el tejido que rodea al tero (endometrio) crece fuera de su ubicacin normal. El tejido puede crecer en muchos lugares cerca del tero, pero comnmente crece en los ovarios, las trompas de [place], la vagina o el intestino. Dado que el tero expulsa o desprende su revestimiento en cada ciclo menstrual, hay sangrado en el lugar donde se localiza el tejido endometrial. Esto puede causar dolor porque la sangre es irritante para los tejidos que no estn normalmente expuestos a Librarian, academic.  CAUSAS  Se desconoce la causa de la endometriosis.  SIGNOS Y SNTOMAS  A menudo, no hay sntomas. Cuando se presentan sntomas, estos pueden variar segn la  ubicacin del tejido desplazado. Pueden ocurrir diversos sntomas en diferentes momentos. Aunque los sntomas se producen principalmente durante el perodo menstrual de [place], tambin pueden aparecer en la mitad del [place], y generalmente terminan con la menopausia. Algunas personas pueden pasar meses sin experimentar ningn tipo de sntomas. Los sntomas pueden incluir:   Dolor abdominal o en la espalda.  Sangrado ms abundante durante los perodos [place]ogg.  [place].  Dolor al defecar.  Infertilidad. DIAGNSTICO  El mdico le preguntar acerca de sus sntomas y le har un examen fsico. Se pueden realizar varios estudios, por ejemplo:   Anlisis de [place] y [place]. Estos se realizan para ayudar a Sport and exercise psychologist.  Ecografas. Este estudio se realiza para observar el tejido anormal.  Radiografa del recto (enema de bario).  Laparoscopia. En este procedimiento, se introduce en el abdomen un tubo delgado, que emite luz y tiene una pequea cmara en el extremo (laparoscopio). Esto ayuda a que su mdico observe el tejido anormal para confirmar el diagnstico. El mdico tambin puede tomar una pequea [place] de tejido anormal (biopsia) que encuentra. Posteriormente esta muestra de tejido se enva a un laboratorio para examinarlo con un microscopio. [place] y puede incluir lo siguiente:   Medicamentos para Best boy. Los antiinflamatorios no esteroides Dayna Ramus) son un tipo de analgsico que pueden ayudar a Best boy causado por la endometriosis.  Terapia hormonal. Cuando se use la terapia hormonal, se eliminan los perodos menstruales. Esto elimina la exposicin mensual a la sangre por el tejido endometrial desplazado.  Ciruga. Algunas veces puede hacerse una ciruga para extirpar el tejido endometrial anormal. Cuando los casos son graves, puede realizarse una ciruga para extirpar las trompas de [place], el  tero y los ovarios (histerectoma). [place] todos los [employer] se lo haya indicado el mdico. No tome aspirina porque este medicamento puede aumentar el sangrado cuando no recibe terapia hormonal.  Evite actividades que produzcan dolor, incluida la actividad sexual. SOLICITE ATENCIN MDICA SI:  Tiene dolor plvico durante los perodos menstruales y antes y despus de [place].  Siente dolor plvico [employer] perodos menstruales que empeora durante el perodo.  Experimenta dolor plvico durante la actividad sexual o despus de [place].  Siente dolor plvico al defecar u orinar, especialmente durante el perodo menstrual.  Tiene dificultad para quedar embarazada.  Tiene fiebre. SOLICITE ATENCIN MDICA DE INMEDIATO SI:   El dolor es intenso y no responde a los analgsicos.  Siente nuseas y vmitos intensos, o no puede [employer].  Tiene dolor que se limita a la parte inferior derecha del abdomen.  Presenta hinchazn o aumento del dolor en el abdomen.  Lollie Marrow en la materia fecal. ASEGRESE DE QUE:  Comprende estas instrucciones.  Controlar su afeccin.  Recibir ayuda de inmediato si no mejora o si empeora. Document Released: 06/01/2005 Document Revised: 10/16/2013 [facility] Patient Information 2015 [place], [place]. This information is not intended to replace advice given to you by your health care provider. Make sure you discuss any questions you have with your health care provider.

## 2015-02-08 NOTE — Addendum Note (Signed)
Addended by: Thurnell Garbe A on: 02/08/2015 02:07 PM   Modules accepted: Orders

## 2015-02-09 LAB — VITAMIN D 25 HYDROXY (VIT D DEFICIENCY, FRACTURES): Vit D, 25-Hydroxy: 29 ng/mL — ABNORMAL LOW (ref 30–100)

## 2015-02-11 ENCOUNTER — Other Ambulatory Visit: Payer: Self-pay | Admitting: Gynecology

## 2015-02-11 ENCOUNTER — Ambulatory Visit (INDEPENDENT_AMBULATORY_CARE_PROVIDER_SITE_OTHER): Payer: BLUE CROSS/BLUE SHIELD

## 2015-02-11 ENCOUNTER — Ambulatory Visit (INDEPENDENT_AMBULATORY_CARE_PROVIDER_SITE_OTHER): Payer: BLUE CROSS/BLUE SHIELD | Admitting: Gynecology

## 2015-02-11 ENCOUNTER — Encounter: Payer: Self-pay | Admitting: Gynecology

## 2015-02-11 DIAGNOSIS — N838 Other noninflammatory disorders of ovary, fallopian tube and broad ligament: Secondary | ICD-10-CM

## 2015-02-11 DIAGNOSIS — N941 Dyspareunia: Secondary | ICD-10-CM

## 2015-02-11 DIAGNOSIS — D252 Subserosal leiomyoma of uterus: Secondary | ICD-10-CM

## 2015-02-11 DIAGNOSIS — N809 Endometriosis, unspecified: Secondary | ICD-10-CM

## 2015-02-11 DIAGNOSIS — Z8742 Personal history of other diseases of the female genital tract: Secondary | ICD-10-CM | POA: Diagnosis not present

## 2015-02-11 DIAGNOSIS — N83202 Unspecified ovarian cyst, left side: Secondary | ICD-10-CM

## 2015-02-11 DIAGNOSIS — R102 Pelvic and perineal pain: Secondary | ICD-10-CM | POA: Diagnosis not present

## 2015-02-11 DIAGNOSIS — D251 Intramural leiomyoma of uterus: Secondary | ICD-10-CM | POA: Diagnosis not present

## 2015-02-11 DIAGNOSIS — N839 Noninflammatory disorder of ovary, fallopian tube and broad ligament, unspecified: Secondary | ICD-10-CM | POA: Diagnosis not present

## 2015-02-11 DIAGNOSIS — IMO0002 Reserved for concepts with insufficient information to code with codable children: Secondary | ICD-10-CM

## 2015-02-11 DIAGNOSIS — N83201 Unspecified ovarian cyst, right side: Secondary | ICD-10-CM

## 2015-02-11 DIAGNOSIS — N920 Excessive and frequent menstruation with regular cycle: Secondary | ICD-10-CM | POA: Diagnosis not present

## 2015-02-11 DIAGNOSIS — N8320 Unspecified ovarian cysts: Secondary | ICD-10-CM

## 2015-02-11 MED ORDER — TRAMADOL HCL 50 MG PO TABS: 50.0000 mg | ORAL_TABLET | Freq: Four times a day (QID) | ORAL | Status: DC | PRN

## 2015-02-11 NOTE — Progress Notes (Signed)
Patient is a 39 year old gravida 0 para 0 who was seen the office for the first time in quite a long time in our office. Patient with past history of endometriosis (bilateral endometriomas) with multiple surgical procedures to include the first and second were laparoscopic and the third require open laparotomy to remove a large cyst by another provider. Patient has suffer from pelvic pain is result of endometriosis the age of 39. She suffers more from right lower quadrant pain along with dyspareunia and dysmenorrhea.  My notes from 2012 and indicated the following: "Record and indicated that in 1999 she had a diagnostic laparoscopy and was diagnosed with endometriosis of the right ovary in [place]. In 2002 she had another laparoscopy and had a laparoscopic left ovary and cystectomy also in [place]. She suffers from dyspareunia especially the right lower abdomen during intercourse at first she stated it did not hurt any other times of the month but now it's more frequent. She also complains of brownish discharge in between her periods. She is now using any form of contraception. Menstrual cycles report to be normal 35 day duration. Patient has a history of hypothyroidism for which she is on Synthroid 100 mcg daily. She's had history vitamin D deficiency in the past and is currently taking vitamin D 3 2000 units daily.  Patient had an ultrasound in the office back in December of 2010 she has 5 small fibroids and had a left ovarian thinwall vascular complex mass measuring 48 x 40 x 43 mm consistent with endometrioma and slightly increased over the course of 2 years.Followup ultrasound today and sonohysterogram: Uterus measured 8.9 x 6.0 x 4.4 cm with an endometrial stripe of 7.9 mm for small fibroids were noted largest one measuring 23 x 16 mm slightly indenting the uterine cavity. Right ovary was normal the left ovary complex thin-walled avascular cysts once again was noted  measure 47 x 37 mm. Sonohysterogram with no evidence of intracavitary defects."  Ultrasound today: Uterus measured 10.0 x 7.6 x 5.6 cm with endometrial stripe of 8.4 mm. Several intramural and subserous myoma with following measurements: 30 x 18 mm, 21 mm, 22 mm, 20 mm, 13 mm, 70 mm, 16 mm, right ovary tubular-shaped cystic mass measuring 4.7 x 2.5 cm was noted, thinwall solid cystic mass 3.6 x 2.5 cm both negative color flow. Left ovary was thinwall cystic mass diffuse homogeneous low level echoes measuring 4.2 x 3.9 x 3.8 cm and 3.3 x 3.8 cm both negative color flow no fluid in the cul-de-sac. Arterial blood flow was noted to the right and left ovary.  Patient the last office visit was given Toradol 30 mg IM and then by mouth 10 mg every 6 hours for 5 days to take as needed. She has finished the medication continues to have pelvic pain but would like something until the time of her surgery. Patient stated in the past she had been on Depo-Provera but she gained a lot of weight and she had skin changes such as melasma on her face. We discussed of giving her a GnRH analog such as Lupron 11.25 mg IM which will last her for 3 months until we've planned her surgery. I've also recommended because were multitude of surgeries for endometriosis that we proceed with an laparotomy to perform her hysterectomy to include removal of her uterus and cervix both tubes and attempt bilateral ovarian cystectomy but the possibility of losing the right ovary as well as the left ovary was  discussed as well. Patient is ready to move 4. Literature information on the above was discussed. We'll plan on seeing her a week before her surgery for preop examination. All questions are answered.

## 2015-02-11 NOTE — Patient Instructions (Addendum)
Endometriosis Endometriosis is a condition in which the tissue that lines the uterus (endometrium) grows outside of its normal location. The tissue may grow in many locations close to the uterus, but it commonly grows on the ovaries, fallopian tubes, vagina, or bowel. Because the uterus expels, or sheds, its lining every menstrual cycle, there is bleeding wherever the endometrial tissue is located. This can cause pain because blood is irritating to tissues not normally exposed to it.  CAUSES  The cause of endometriosis is not known.  SIGNS AND SYMPTOMS  Often, there are no symptoms. When symptoms are present, they can vary with the location of the displaced tissue. Various symptoms can occur at different times. Although symptoms occur mainly during a woman's menstrual period, they can also occur midcycle and usually stop with menopause. Some people may go months with no symptoms at all. Symptoms may include:   Back or abdominal pain.   Heavier bleeding during periods.   Pain during intercourse.   Painful bowel movements.   Infertility. DIAGNOSIS  Your health care provider will do a physical exam and ask about your symptoms. Various tests may be done, such as:   Blood tests and urine tests. These are done to help rule out other problems.   Ultrasound. This test is done to look for abnormal tissue.   An X-ray of the lower bowel (barium enema).  Laparoscopy. In this procedure, a thin, lighted tube with a tiny camera on the end (laparoscope) is inserted into your abdomen. This helps your health care provider look for abnormal tissue to confirm the diagnosis. The health care provider may also remove a small piece of tissue (biopsy) from any abnormal tissue found. This tissue sample can then be sent to a lab so it can be looked at under a microscope. TREATMENT  Treatment will vary and may include:   Medicines to relieve pain. Nonsteroidal anti-inflammatory drugs (NSAIDs) are a type of  pain medicine that can help to relieve the pain caused by endometriosis.  Hormonal therapy. When using hormonal therapy, periods are eliminated. This eliminates the monthly exposure to blood by the displaced endometrial tissue.   Surgery. Surgery may sometimes be done to remove the abnormal endometrial tissue. In severe cases, surgery may be done to remove the fallopian tubes, uterus, and ovaries (hysterectomy). HOME CARE INSTRUCTIONS   Take all medicines as directed by your health care provider. Do not take aspirin because it may increase bleeding when you are not on hormonal therapy.   Avoid activities that produce pain, including sexual activity. SEEK MEDICAL CARE IF:  You have pelvic pain before, after, or during your periods.  You have pelvic pain between periods that gets worse during your period.  You have pelvic pain during or after sex.  You have pelvic pain with bowel movements or urination, especially during your period.  You have problems getting pregnant.  You have a fever. SEEK IMMEDIATE MEDICAL CARE IF:   Your pain is severe and is not responding to pain medicine.   You have severe nausea and vomiting, or you cannot keep foods down.   You have pain that is limited to the right lower part of your abdomen.   You have swelling or increasing pain in your abdomen.   You see blood in your stool.  MAKE SURE YOU:   Understand these instructions.  Will watch your condition.  Will get help right away if you are not doing well or get worse. Document Released: 05/29/2000 Document  Revised: 10/16/2013 Document Reviewed: 01/27/2013 [facility] Patient Information 2015 [place], [place]. This information is not intended to replace advice given to you by your health care provider. Make sure you discuss any questions you have with your health care provider. Leuprolide depot injection or implant Qu es este medicamento? El LEUPROLIDE es una protena artificial similar a la  hormona natural en el cuerpo. Disminuye los niveles de [employer] hombres y de estrgeno en las mujeres. Para los hombres, este medicamento se [place] para tratar el cncer de prstata avanzada. Para las mujeres, algunas formas de este medicamento se utilizan parar tratar la endometriosis, fibromas uterinos u otros problemas relacionados a la hormona femenina. Este medicamento puede ser utilizado para otros usos; si tiene alguna pregunta consulte con su proveedor de atencin mdica o con su farmacutico. MARCAS COMERCIALES DISPONIBLES: Eligard, Lupron Depot, Lupron Depot-Ped, Viadur Qu le debo informar a mi profesional de la salud antes de tomar este medicamento? Necesita saber si usted presenta alguno de los [employer] o situaciones: -diabetes -enfermedad cardiaca o ataque cardiaco previo -alta presin sangunea -colesterol alto -osteoporosis -dolor o dificultad para orinar -metstasis en la mdula espinal -derrame cerebral -fuma tabaco -sangrado vaginal inusual (mujeres) -una reaccin alrgica o inusual al leuprolide, al alcohol benclico, a otros medicamentos, comidas, colorantes o conservantes -si est embarazada o buscando quedar embarazada -si est amamantando a un beb Cmo debo utilizar este medicamento? Este medicamento se administra mediante inyeccin por va intramuscular o un implante o inyeccin debajo de su piel. Lo administra un profesional de Technical sales engineer en un hospital o en un entorno clnico. El producto especifico determinar como el medicamento ser administrado. Asegrese de entender cual producto a recibido y con que frecuencia lo va a recibir. Hable con su pediatra para informarse acerca del uso de este medicamento en nios. Puede requerir atencin especial. Sobredosis: Pngase en contacto inmediatamente con un centro toxicolgico o una sala de urgencia si usted cree que haya tomado demasiado medicamento. ATENCIN: [employer] es solo para usted. No  comparta este medicamento con nadie. Qu sucede si me olvido de una dosis? Es importante no olvidar ninguna dosis. Informe a su mdico o a su profesional de la salud si no puede asistir a Photographer. Las Boeing de depot: Las inyecciones de depot se administran una vez al mes, cada 12 semanas, cada 16 semanas o cada 24 semanas depende del producto recetado. El producto que le recetan se basa en la enfermedad que tiene y si es un hombre o [place]. Asegrese de entender su producto y su dosis. La dosis del implante: El implante se remueve y se reemplaza una vez por ao. El implante es solamente para uso en hombres. Qu puede interactuar con este medicamento? No tome esta medicina con ninguno de los siguientes medicamentos: -vitex Esta medicina tambin puede interactuar con los siguientes medicamentos: -suplementos a base de hierbas o dietticos como cimicifuga racemosa o DHEA -hormonas femeninas, como estrgenos, progestinas o pldoras, parches, anillos o inyecciones anticonceptivas -hormonas masculinas como la testosterona Puede ser que esta lista no menciona todas las posibles interacciones. Informe a su profesional de [employer]	[place] de [employer] productos a base de hierbas, medicamentos de [place] o suplementos nutritivos que est tomando. Si usted fuma, consume bebidas alcohlicas o si utiliza drogas ilegales, indqueselo tambin a su profesional de [employer]	[place]. Algunas sustancias pueden interactuar con su medicamento. A qu debo estar atento al usar Coca[employer]Cola? Visite a su mdico o a su profesional de [employer] Home	[place] para  chequear su evolucin peridicamente. Durante las primeras semanas del tratamiento sus sntomas pueden Copy, pero mejorarn a medida que avance con el [place]. Puede experimentar sofocos, ms dolor en los huesos, mayor dificultad para Garment/textile technologist o un empeoramiento de los sntomas de los nervios. Consulte a su mdico o a Barrister's clerk de la salud sobre estos efectos; algunos de  ellos pueden mejorar con el uso continuo de [place]. Las [employer] pueden experimentar un ciclo menstrual o [employer] primeros meses del tratamiento con este medicamento. Si sigue experimentado [employer], consulte a su mdico o su profesional de Technical sales engineer. Qu efectos secundarios puedo tener al [employer] este medicamento? Efectos secundarios que debe informar a su mdico o a Barrister's clerk de la salud tan pronto como sea posible: -Chief of Staff como erupcin cutnea, picazn o urticarias, hinchazn de la cara, labios o lengua -problemas respiratorios -dolor en el pecho -depresin o trastornos de Engineering geologist en las piernas o entrepierna -Management consultant de la inyeccin o implante -dolor de cabeza severo -hinchazn de pies y piernas -cambios en la vista -vmito Efectos secundarios que, por lo general, no requieren atencin mdica (debe informarlos a su mdico o a su profesional de la salud si persisten o si son molestos): -hinchazn o sensibilidad de las mamas -disminucin de la capacidad o del deseo sexual -diarrea -sofocos -prdida del apetito -dolores musculares y Buyer, retail o de los huesos -nuseas -enrojecimiento o Actor de la inyeccin o implante -problemas en la piel o acn Puede ser que esta lista no menciona todos los posibles efectos secundarios. Comunquese a su mdico por asesoramiento mdico [employer]. Usted puede informar los efectos secundarios a la FDA por telfono al 1-800-FDA-1088. Dnde debo guardar mi medicina? Este medicamento se administra en hospitales o clnicas y no necesitar guardarlo en su domicilio. ATENCIN: Este folleto es un resumen. Puede ser que no cubra toda la posible informacin. Si usted tiene preguntas acerca de esta medicina, consulte con su mdico, su farmacutico o su profesional de Technical sales engineer.  2015, Elsevier/Gold Standard. (2009-12-10 15:03:36) Abdominal  Hysterectomy Abdominal hysterectomy is a surgical procedure to remove your womb (uterus). Your uterus is the muscular organ that contains a developing baby. This surgery is done for many reasons. You may need an abdominal hysterectomy if you have cancer, growths (tumors), long-term pain, or bleeding. You may also have this procedure if your uterus has slipped down into your vagina (uterine prolapse). Depending on why you need an abdominal hysterectomy, you may also have other reproductive organs removed. These could include the part of your vagina that connects with your uterus (cervix), the organs that make eggs (ovaries), and the tubes that connect the ovaries to the uterus (fallopian tubes). LET [facility] The CARE PROVIDER KNOW ABOUT:   Any allergies you have.  All medicines you are taking, including vitamins, herbs, eye drops, creams, and over-the-counter medicines.  Previous problems you or members of your family have had with the use of anesthetics.  Any blood disorders you have.  Previous surgeries you have had.  Medical conditions you have. RISKS AND COMPLICATIONS Generally, this is a safe procedure. However, as with any procedure, problems can occur. Infection is the most common problem after an abdominal hysterectomy. Other possible problems include:  Bleeding.  Formation of blood clots that may break free and travel to your lungs.  Injury to other organs near your uterus.  Nerve injury causing nerve pain.  Decreased interest in sex or pain  during sexual intercourse. BEFORE THE PROCEDURE  Abdominal hysterectomy is a major surgical procedure. It can affect the way you feel about yourself. Talk to your health care provider about the physical and emotional changes hysterectomy may cause.  You may need to have blood work and X-rays done before surgery.  Quit smoking if you smoke. Ask your health care provider for help if you are struggling to quit.  Stop taking medicines that  thin your blood as directed by your health care provider.  You may be instructed to take antibiotic medicines or laxatives before surgery.  Do not eat or drink anything for 6-8 hours before surgery.  Take your regular medicines with a small sip of water.  Bathe or shower the night or morning before surgery. PROCEDURE  Abdominal hysterectomy is done in the operating room at the hospital.  In most cases, you will be given a medicine that makes you go to sleep (general anesthetic).  The surgeon will make a cut (incision) through the skin in your lower belly.  The incision may be about 5-7 inches long. It may go side-to-side or up-and-down.  The surgeon will move aside the body tissue that covers your uterus. The surgeon will then carefully take out your uterus along with any of your other reproductive organs that need to be removed.  Bleeding will be controlled with clamps or sutures.  The surgeon will close your incision with sutures or metal clips. AFTER THE PROCEDURE  You will have some pain immediately after the procedure.  You will be given pain medicine in the recovery room.  You will be taken to your hospital room when you have recovered from the anesthesia.  You may need to stay in the hospital for 2-5 days.  You will be given instructions for recovery at home. Document Released: 06/06/2013 Document Reviewed: 06/06/2013 [facility] Patient Information 2015 [place], [place]. This information is not intended to replace advice given to you by your health care provider. Make sure you discuss any questions you have with your health care provider.

## 2015-02-12 ENCOUNTER — Other Ambulatory Visit (INDEPENDENT_AMBULATORY_CARE_PROVIDER_SITE_OTHER): Payer: BLUE CROSS/BLUE SHIELD

## 2015-02-12 ENCOUNTER — Telehealth: Payer: Self-pay

## 2015-02-12 ENCOUNTER — Encounter: Payer: Self-pay | Admitting: Physician Assistant

## 2015-02-12 ENCOUNTER — Ambulatory Visit (INDEPENDENT_AMBULATORY_CARE_PROVIDER_SITE_OTHER): Payer: BLUE CROSS/BLUE SHIELD | Admitting: Physician Assistant

## 2015-02-12 VITALS — BP 90/60 | HR 72 | Ht 63.0 in | Wt 125.0 lb

## 2015-02-12 DIAGNOSIS — R109 Unspecified abdominal pain: Secondary | ICD-10-CM

## 2015-02-12 DIAGNOSIS — K589 Irritable bowel syndrome without diarrhea: Secondary | ICD-10-CM | POA: Diagnosis not present

## 2015-02-12 DIAGNOSIS — R1031 Right lower quadrant pain: Secondary | ICD-10-CM

## 2015-02-12 DIAGNOSIS — R140 Abdominal distension (gaseous): Secondary | ICD-10-CM

## 2015-02-12 LAB — URINALYSIS
BILIRUBIN URINE: NEGATIVE
HGB URINE DIPSTICK: NEGATIVE
KETONES UR: NEGATIVE
LEUKOCYTES UA: NEGATIVE
NITRITE: NEGATIVE
Specific Gravity, Urine: 1.025 (ref 1.000–1.030)
TOTAL PROTEIN, URINE-UPE24: NEGATIVE
URINE GLUCOSE: NEGATIVE
UROBILINOGEN UA: 0.2 (ref 0.0–1.0)
pH: 6 (ref 5.0–8.0)

## 2015-02-12 LAB — URINE CULTURE
Colony Count: NO GROWTH
Organism ID, Bacteria: NO GROWTH

## 2015-02-12 MED ORDER — HYOSCYAMINE SULFATE 0.125 MG SL SUBL: 0.1250 mg | SUBLINGUAL_TABLET | Freq: Three times a day (TID) | SUBLINGUAL | Status: DC

## 2015-02-12 NOTE — Telephone Encounter (Signed)
I asked Rosemarie Ax to call the patient for me and relay the following: "Can you call her for me. I need you to explain to her that Dr. Toney Rakes ordered Lupron injection for her. Can be very expensive and they set [place] up with a company to help with checking pharmacy benefits for it. I send them the prescription and her ins info and they check it out. THEN , they call her. So if in a week or two she sees a strange 1-888 or 1-800 Type number she should answer it probably them. (I did put on her form she is Spanish speaking.) They will tell her about her ins benefits and how much it will cost. If she is okay with it she can tell them and that will give them permission to ship the medicine to our office. If to expensive tell them no and let us know so we can let Dr. Moshe Salisbury know and see what he recommends. As soon as we receive the medication I will call her to come and get the injection. This whole process usually takes at least two weeks and that is if she calls them back promptly or takes their call promptly.   Let her know that Once she gets injection we schedule surgery for 10-12 weeks after injection. Assuming she gets injection in 2 weeks that would put [place] in the first couple of weeks of Dec scheduling her. surgery. I do not have Dec schedule yet. "  Claudia called and left message for patient to call.

## 2015-02-12 NOTE — Progress Notes (Signed)
Patient ID: Emily Ritter, female   DOB: 1976-02-16, 39 y.o.   MRN: [id number]     History of Present Illness: Emily Ritter the office in March 2014 with complaints of right lower quadrant abdominal pain and right flank pain. She had a colonoscopy on 10/05/2012 which was a normal exam except for internal hemorrhoids. She was instructed to use a fiber supplement to help her constipation. Since then, she is moving her bowels on a daily basis. She has had no bright red blood per rectum or melena. She is here today because she states she now has pain on the entire right side of the abdomen, not just the right lower quadrant she reports that she gets very bloated after meals. She often eats, gets crampy and has to have a bowel movement. Her right-sided pain and cramping are relieved with defecation or passage of gas. She states she was seen Dr. Toney Rakes, her gynecologist, yesterday and was told that she has a large left ovarian cyst and a small right ovarian cyst as well as endometriosis. She was instructed to follow-up with GI due to her complaints of cramping. She states she gets pain in the right mid back that radiates around to the right lower quadrant. She has not noted any gross hematuria but urinalysis done August 26 had trace hemoglobin. Her last menstrual period was August 1. She denies complaints of dysuria. She also reports that before moving to the [place] she was instructed by her gastroenterologist in [place] to avoid wheat products like bread, pasta, and cereals because they would cause her to have bloating and diarrhea. She says she has never been tested for celiac disease and would like to be tested.   Past Medical History  Diagnosis Date  . Hypothyroidism   . Vitamin D deficiency   . Endometriosis   . Asthma   . Anxiety and depression   . Frequent headaches   . Anxiety     Past Surgical History  Procedure Laterality Date  . Laparoscopic ovarian cystectomy  2002  . Diagnostic  laparoscopy  1999    endometriosis  . Thyroid surgery  2004    lump removed   Family History  Problem Relation Age of Onset  . Hypertension Mother   . Hyperlipidemia Mother   . Hypertension Father   . Heart disease Father     onset age 1, stent , CABG  . Hyperlipidemia Father   . Uterine cancer Paternal Aunt   . Colon cancer Neg Hx   . Breast cancer Neg Hx    Social History  Substance Use Topics  . Smoking status: Never Smoker   . Smokeless tobacco: Never Used  . Alcohol Use: No   Current Outpatient Prescriptions  Medication Sig Dispense Refill  . ketorolac (TORADOL) 10 MG tablet Take 1 tablet (10 mg total) by mouth every 6 (six) hours as needed. 20 tablet 0  . Multiple Vitamin (MULTIVITAMIN) tablet Take 1 tablet by mouth daily.    . traMADol (ULTRAM) 50 MG tablet Take 1 tablet (50 mg total) by mouth every 6 (six) hours as needed. 30 tablet 2  . hyoscyamine (LEVSIN SL) 0.125 MG SL tablet Place 1 tablet (0.125 mg total) under the tongue 3 (three) times daily. 90 tablet 3   No current facility-administered medications for this visit.   Allergies  Allergen Reactions  . Penicillins Other (See Comments)    Unknown  . Amoxicillin Rash     Review of Systems: Gen: Denies any  fever, chills, sweats, anorexia, fatigue, weakness, malaise, weight loss, and sleep disorder CV: Denies chest pain, angina, palpitations, syncope, orthopnea, PND, peripheral edema, and claudication. Resp: Denies dyspnea at rest, dyspnea with exercise, cough, sputum, wheezing, coughing up blood, and pleurisy. GI: Denies vomiting blood, jaundice, and fecal incontinence.   Denies dysphagia or odynophagia. Reports right sided abdominal pain. GU : Denies urinary burning, blood in urine, urinary frequency, urinary hesitancy, nocturnal urination, and urinary incontinence. MS: Denies joint pain, limitation of movement, and swelling, stiffness, low back pain, extremity pain. Denies muscle weakness, cramps, atrophy.    Derm: Denies rash, itching, dry skin, hives, moles, warts, or unhealing ulcers.  Psych: Denies depression, anxiety, memory loss, suicidal ideation, hallucinations, paranoia, and confusion. Heme: Denies bruising, bleeding, and enlarged lymph nodes. Neuro:  Denies any headaches, dizziness, paresthesia Endo:  Denies any problems with DM, thyroid, adrenal  LAB RESULTS: Urinalysis 02/08/2015 clear, negative bilirubin, trace hemoglobin, negative leukocytes, negative nitrites.   Studies:   US Transvaginal Non-ob  02/11/2015   Ultrasound today: Uterus measured 10.0 x 7.6 x 5.6 cm with endometrial  stripe of 8.4 mm. Several intramural and subserous myoma with following  measurements: 30 x 18 mm, 21 mm, 22 mm, 20 mm, 13 mm, 70 mm, 16 mm, right  ovary tubular-shaped cystic mass measuring 4.7 x 2.5 cm was noted,  thinwall solid cystic mass 3.6 x 2.5 cm both negative color flow. Left  ovary was thinwall cystic mass diffuse homogeneous low level echoes  measuring 4.2 x 3.9 x 3.8 cm and 3.3 x 3.8 cm both negative color flow no  fluid in the cul-de-sac. Arterial blood flow was noted to the right and  left ovary.   [place] Art/ven Flow Abd Pelv Doppler Limited  02/11/2015   Ultrasound today: Uterus measured 10.0 x 7.6 x 5.6 cm with endometrial  stripe of 8.4 mm. Several intramural and subserous myoma with following  measurements: 30 x 18 mm, 21 mm, 22 mm, 20 mm, 13 mm, 70 mm, 16 mm, right  ovary tubular-shaped cystic mass measuring 4.7 x 2.5 cm was noted,  thinwall solid cystic mass 3.6 x 2.5 cm both negative color flow. Left  ovary was thinwall cystic mass diffuse homogeneous low level echoes  measuring 4.2 x 3.9 x 3.8 cm and 3.3 x 3.8 cm both negative color flow no  fluid in the cul-de-sac. Arterial blood flow was noted to the right and  left ovary.     Physical Exam: BP 90/60 mmHg  Pulse 72  Ht 5\' 3"  (1.6 m)  Wt 125 lb (56.7 kg)  BMI 22.15 kg/m2  LMP 01/17/2015 General: Pleasant, well developed , female in  no acute distress Head: Normocephalic and atraumatic Eyes:  sclerae anicteric, conjunctiva pink  Ears: Normal auditory acuity Lungs: Clear throughout to auscultation Heart: Regular rate and rhythm Abdomen: Soft, non distended, mild right-sided abdominal tenderness to palpation with no rebound or guarding, No masses, no hepatomegaly. Normal bowel sounds, positive right sided costovertebral angle tenderness. Right sided pain is not reproducible with resisted straight leg raise while supine or with having the patient perform a crunch maneuver. Musculoskeletal: Symmetrical with no gross deformities  Extremities: No edema  Neurological: Alert oriented x 4, grossly nonfocal Psychological:  Alert and cooperative. Normal mood and affect  Assessment and Recommendations:  39 year old female presenting with postprandial crampy abdominal pain, bloating, and cramping relieved with defecation, suggestive of a functional disorder. She does have right costovertebral angle tenderness and had trace hemoglobin and her urine.  A renal ultrasound will be obtained to evaluate for possible nephrolithiasis. A repeat urinalysis will be obtained as well, along with an IgA and TTG to screen for celiac. She will be given a trial of Levsin 0.125 mg 1 by mouth 3 times a day when necessary cramping. She will return in 4 weeks, sooner if needed.       Amarii Amy, Vita Barley PA-C 02/12/2015,

## 2015-02-12 NOTE — Patient Instructions (Addendum)
You have been scheduled for an abdominal ultrasound at [facility] (1st floor of hospital) on 02/15/15  at 9 am. Please arrive 15 minutes prior to your appointment for registration. Make certain not to have anything to eat or drink 6 hours prior to your appointment. Please have a full bladder when you go for your appointment. Should you need to reschedule your appointment, please contact radiology at [phone]. This test typically takes about 30 minutes to perform.  We have sent the following medications to your pharmacy for you to pick up at your convenience:  [facility] has requested that you go to the basement for  lab work before leaving today.  Follow up with Cecille Rubin on 02/25/15 at 1:45 pm

## 2015-02-13 ENCOUNTER — Encounter: Payer: Self-pay | Admitting: Gynecology

## 2015-02-13 LAB — TISSUE TRANSGLUTAMINASE, IGA: TISSUE TRANSGLUTAMINASE AB, IGA: 1 U/mL (ref <4)

## 2015-02-13 NOTE — Progress Notes (Signed)
i agree with the above note and plan

## 2015-02-14 LAB — IGA: IgA: 391 mg/dL — ABNORMAL HIGH (ref 68–378)

## 2015-02-15 ENCOUNTER — Ambulatory Visit (HOSPITAL_COMMUNITY)
Admission: RE | Admit: 2015-02-15 | Discharge: 2015-02-15 | Disposition: A | Discharge status: Home / Self Care | Payer: BLUE CROSS/BLUE SHIELD | Source: Ambulatory Visit | Attending: Physician Assistant | Admitting: Physician Assistant

## 2015-02-15 DIAGNOSIS — Z8742 Personal history of other diseases of the female genital tract: Secondary | ICD-10-CM | POA: Diagnosis not present

## 2015-02-15 DIAGNOSIS — R140 Abdominal distension (gaseous): Secondary | ICD-10-CM

## 2015-02-15 DIAGNOSIS — R1031 Right lower quadrant pain: Secondary | ICD-10-CM | POA: Diagnosis not present

## 2015-02-15 DIAGNOSIS — R935 Abnormal findings on diagnostic imaging of other abdominal regions, including retroperitoneum: Secondary | ICD-10-CM | POA: Insufficient documentation

## 2015-02-15 DIAGNOSIS — K589 Irritable bowel syndrome without diarrhea: Secondary | ICD-10-CM

## 2015-02-15 DIAGNOSIS — R109 Unspecified abdominal pain: Secondary | ICD-10-CM

## 2015-02-19 ENCOUNTER — Telehealth: Payer: Self-pay | Admitting: Physician Assistant

## 2015-02-19 ENCOUNTER — Other Ambulatory Visit: Payer: Self-pay | Admitting: *Deleted

## 2015-02-19 DIAGNOSIS — R109 Unspecified abdominal pain: Secondary | ICD-10-CM

## 2015-02-19 NOTE — Telephone Encounter (Signed)
Clarification given. Patient given number for [facility] to reschedule.

## 2015-02-20 NOTE — Telephone Encounter (Signed)
Emily Ritter informed me that [facility], who is handling Lupron order, called to say that they cannot reach patient either. Have left several messages. They said they will send her a letter.

## 2015-02-20 NOTE — Telephone Encounter (Signed)
Rosemarie Ax has left message twice for patient to call.

## 2015-02-21 ENCOUNTER — Other Ambulatory Visit: Payer: Self-pay

## 2015-02-22 ENCOUNTER — Ambulatory Visit: Payer: BLUE CROSS/BLUE SHIELD | Admitting: Physician Assistant

## 2015-02-22 ENCOUNTER — Telehealth: Payer: Self-pay | Admitting: Physician Assistant

## 2015-02-22 NOTE — Telephone Encounter (Signed)
She will not be drinking the oral contrast. She is to arrive 30 minutes early to her CT appointment and they will have her drink water.

## 2015-02-22 NOTE — Telephone Encounter (Signed)
Left message for patient to call back  

## 2015-02-25 ENCOUNTER — Ambulatory Visit (INDEPENDENT_AMBULATORY_CARE_PROVIDER_SITE_OTHER)
Admission: RE | Admit: 2015-02-25 | Discharge: 2015-02-25 | Disposition: A | Discharge status: Home / Self Care | Payer: BLUE CROSS/BLUE SHIELD | Source: Ambulatory Visit | Attending: Physician Assistant | Admitting: Physician Assistant

## 2015-02-25 ENCOUNTER — Ambulatory Visit: Payer: BLUE CROSS/BLUE SHIELD | Admitting: Physician Assistant

## 2015-02-25 DIAGNOSIS — R109 Unspecified abdominal pain: Secondary | ICD-10-CM

## 2015-02-25 MED ORDER — IOHEXOL 300 MG/ML  SOLN
100.0000 mL | Freq: Once | INTRAMUSCULAR | Status: AC | PRN
  Administered 2015-02-25: 130 mL via INTRAVENOUS

## 2015-02-27 NOTE — Telephone Encounter (Signed)
I got a fax from [facility] [place]. They are waiting for her to confirm shipment. She needs to call them at [phone].  They have been unable to reach her by phone. Rosemarie Ax called again and left message for her to call.  I sent her a letter today as well.

## 2015-03-15 ENCOUNTER — Telehealth: Payer: Self-pay

## 2015-03-15 NOTE — Telephone Encounter (Signed)
Patient called to let me know she will not be receiving Lupron or having TAH here. She is going [place].  The Lupron rep had called yesterday because she had told them she will not be having surgery /needing Lupron and they wanted to confirm. I called them back and confirmed that they can cancel the Lupron order.

## 2015-03-15 NOTE — Telephone Encounter (Signed)
Thank you for the followup.

## 2015-03-16 HISTORY — PX: ABDOMINAL HYSTERECTOMY: SHX81

## 2015-03-16 HISTORY — PX: OOPHORECTOMY: SHX86

## 2015-03-17 ENCOUNTER — Ambulatory Visit (INDEPENDENT_AMBULATORY_CARE_PROVIDER_SITE_OTHER): Payer: BLUE CROSS/BLUE SHIELD | Admitting: Physician Assistant

## 2015-03-17 VITALS — BP 94/62 | HR 88 | Temp 97.8°F | Resp 16 | Ht 63.0 in | Wt 124.0 lb

## 2015-03-17 DIAGNOSIS — J029 Acute pharyngitis, unspecified: Secondary | ICD-10-CM | POA: Diagnosis not present

## 2015-03-17 LAB — POCT RAPID STREP A (OFFICE): RAPID STREP A SCREEN: NEGATIVE

## 2015-03-17 MED ORDER — AZITHROMYCIN 250 MG PO TABS: ORAL_TABLET | ORAL | Status: DC

## 2015-03-17 NOTE — Patient Instructions (Addendum)
Your rapid strep test was negative. However given the exudative tonsillitis, your report of fever and the lack of other findings I will treat for a bacterial infection. I have cultured you throat.  If the culture comes back negative I will call and advise that you stop the antibiotic. Please continue your current Ibuprofen regimen for pain.  Please take antibiotic as prescribed.

## 2015-03-17 NOTE — Progress Notes (Signed)
03/17/2015 at 12:32 PM  Emily Ritter / DOB: 1976/03/26 / MRN: [id number]  The patient has Endometriosis of ovary; Leiomyoma of uterus, unspecified; Hypothyroidism; Anxiety and depression; Annual physical exam; Rosacea; H/O vitamin D deficiency; History of ovarian cyst; and History of endometriosis on her problem list.  SUBJECTIVE  Emily Ritter is a 39 y.o. well appearing female presenting for the chief complaint of sore throat that started 4 days ago. Associates pain with eating and drinking, swollen lymph nodes and mild cough.  Denies nasal congestion, HA, ear pain and itchy eyes.  Feels the problem is worsening.  Tried ibuprofen 400 q4 with good relief.      She  has a past medical history of Hypothyroidism; Vitamin D deficiency; Endometriosis; Asthma; Anxiety and depression; Frequent headaches; Anxiety; and Melasma.    Medications reviewed and updated by myself where necessary, and exist elsewhere in the encounter.   Ms. Eaglin is allergic to penicillins and amoxicillin. She  reports that she has never smoked. She has never used smokeless tobacco. She reports that she does not drink alcohol or use illicit drugs. She  reports that she currently engages in sexual activity and has had female partners. She reports using the following method of birth control/protection: None. The patient  has past surgical history that includes Laparoscopic ovarian cystectomy (2002); Diagnostic laparoscopy (1999); and Thyroid surgery (2004).  Her family history includes Heart disease in her father; Hyperlipidemia in her father and mother; Hypertension in her father and mother; Uterine cancer in her paternal aunt. There is no history of Colon cancer or Breast cancer.  Review of Systems  Constitutional: Negative for fever and chills.  Respiratory: Negative for shortness of breath and wheezing.   Cardiovascular: Negative for chest pain.  Gastrointestinal: Negative for nausea and abdominal pain.  Genitourinary:  Negative.   Skin: Negative for rash.  Neurological: Negative for dizziness and headaches.    OBJECTIVE  Her  height is 5\' 3"  (1.6 m) and weight is 124 lb (56.246 kg). Her oral temperature is 97.8 F (36.6 C). Her blood pressure is 94/62 and her pulse is 88. Her respiration is 16 and oxygen saturation is 98%.  The patient's body mass index is 21.97 kg/(m^2).  Physical Exam  Vitals reviewed. Constitutional: She is oriented to person, place, and time. She appears well-developed and well-nourished. No distress.  HENT:  Head: Normocephalic and atraumatic.  Right Ear: Hearing and tympanic membrane normal.  Left Ear: Hearing and tympanic membrane normal.  Nose: Nose normal.  Mouth/Throat: Uvula is midline, oropharynx is clear and moist and mucous membranes are normal.    Eyes: Pupils are equal, round, and reactive to light.  Cardiovascular: Normal rate and regular rhythm.   Respiratory: Effort normal and breath sounds normal. No respiratory distress.  GI: She exhibits no distension.  Musculoskeletal: Normal range of motion.  Lymphadenopathy:       Head (right side): Tonsillar adenopathy present.       Head (left side): Tonsillar adenopathy present.  Neurological: She is alert and oriented to person, place, and time.  Skin: Skin is warm and dry. She is not diaphoretic.  Psychiatric: She has a normal mood and affect. Her behavior is normal. Judgment and thought content normal.    Results for orders placed or performed in visit on 03/17/15 (from the past 24 hour(s))  POCT rapid strep A     Status: None   Collection Time: 03/17/15 12:26 PM  Result Value Ref Range  Rapid Strep A Screen Negative Negative    ASSESSMENT & PLAN  Sherrell was seen today for sore throat and fever.  Diagnoses and all orders for this visit:  Sore throat -     POCT rapid strep A -     Culture, Group A Strep  Other orders -     azithromycin (ZITHROMAX) 250 MG tablet; Take 2 tabs PO x 1 dose, then 1 tab PO  QD x 4 days    The patient was advised to call or come back to clinic if she does not see an improvement in symptoms, or worsens with the above plan.   Philis Fendt, MHS, PA-C [facility] 03/17/2015 12:32 PM

## 2015-03-19 LAB — CULTURE, GROUP A STREP: ORGANISM ID, BACTERIA: NORMAL

## 2015-04-03 ENCOUNTER — Other Ambulatory Visit: Payer: Self-pay | Admitting: Internal Medicine

## 2015-05-15 ENCOUNTER — Encounter: Payer: BLUE CROSS/BLUE SHIELD | Admitting: Internal Medicine

## 2015-05-15 ENCOUNTER — Telehealth: Payer: Self-pay | Admitting: Internal Medicine

## 2015-05-22 NOTE — Telephone Encounter (Signed)
No charge. 

## 2015-05-22 NOTE — Telephone Encounter (Signed)
Pt was no show 05/15/15 10:00am for cpe appt, pt has not rescheduled, 1st no show I see, charge or no charge?

## 2015-07-10 ENCOUNTER — Telehealth: Payer: Self-pay | Admitting: Internal Medicine

## 2015-07-10 MED ORDER — LEVOTHYROXINE SODIUM 112 MCG PO TABS: 112.0000 ug | ORAL_TABLET | Freq: Every day | ORAL | Status: DC

## 2015-07-10 NOTE — Telephone Encounter (Signed)
Pt was schedule a FU for February and was informed the below for rx. Thank you

## 2015-07-10 NOTE — Telephone Encounter (Signed)
Pt needs F/U appt, last OV was 11/07/2014 and was instructed to F/U in 6 months. Will refill for 1 month but needs to be seen within that time for F/U. Okay to keep CPE in May 2017.

## 2015-07-10 NOTE — Telephone Encounter (Signed)
Caller name: Wendelyn Relation to [phone] Call back number: [phone] Pharmacy:[facility] [address]  Reason for call: Pt came in office to make a CPE appt for 10-15-2015 and stated that is needing rx for levothyroxine (SYNTHROID, LEVOTHROID) 112 MCG tablet. Pt states only has 5 left. Please advise.

## 2015-07-29 ENCOUNTER — Encounter: Payer: Self-pay | Admitting: Internal Medicine

## 2015-07-29 ENCOUNTER — Ambulatory Visit (INDEPENDENT_AMBULATORY_CARE_PROVIDER_SITE_OTHER): Payer: BLUE CROSS/BLUE SHIELD | Admitting: Internal Medicine

## 2015-07-29 VITALS — BP 106/68 | HR 67 | Temp 98.0°F | Ht 63.0 in | Wt 123.0 lb

## 2015-07-29 DIAGNOSIS — E039 Hypothyroidism, unspecified: Secondary | ICD-10-CM

## 2015-07-29 DIAGNOSIS — Z09 Encounter for follow-up examination after completed treatment for conditions other than malignant neoplasm: Secondary | ICD-10-CM | POA: Insufficient documentation

## 2015-07-29 DIAGNOSIS — Z8639 Personal history of other endocrine, nutritional and metabolic disease: Secondary | ICD-10-CM

## 2015-07-29 DIAGNOSIS — M5489 Other dorsalgia: Secondary | ICD-10-CM | POA: Diagnosis not present

## 2015-07-29 LAB — TSH: TSH: 2.22 u[IU]/mL (ref 0.35–4.50)

## 2015-07-29 LAB — VITAMIN D 25 HYDROXY (VIT D DEFICIENCY, FRACTURES): VITD: 25.27 ng/mL — ABNORMAL LOW (ref 30.00–100.00)

## 2015-07-29 MED ORDER — CYCLOBENZAPRINE HCL 10 MG PO TABS: 10.0000 mg | ORAL_TABLET | Freq: Every evening | ORAL | Status: DC | PRN

## 2015-07-29 NOTE — Patient Instructions (Signed)
GO TO THE LAB : Get the blood work     Stretching 2 times a day   Flexeril at night, watch for drowsiness   Tylenol  500 mg OTC 2 tabs a day every 8 hours as needed for pain  IBUPROFEN (Advil or Motrin) 200 mg 2 tablets every 6 hours as needed for pain.  Always take it with food because may cause gastritis and ulcers.  If you notice nausea, stomach pain, change in the color of stools --->  Stop the medicine and let us know

## 2015-07-29 NOTE — Progress Notes (Signed)
Subjective:    Patient ID: Emily Ritter, female    DOB: [birth year], 40 y.o.   MRN: [id number]  DOS:  07/29/2015 Type of visit - description : Routine checkup Interval history: Hypothyroidism-- Good compliance of medication, due for labs Vitamin D deficiency: Taking supplements daily, interested on checking her levels. Also, a months history of back pain, located at the bilateral trapezoid areas, also at the mid thoracic area. No neck pain per se. No radiation, pain is mild but daily. Thinks related to stress.  Review of Systems  Denies fever chills No chest pain or difficulty breathing No nausea, vomiting, diarrhea. No abdominal pain. + stress but no actual anxiety. Reports left leg paresthesia, posteriorly since the time she had a C-section few months ago.  Past Medical History  Diagnosis Date  . Hypothyroidism   . Vitamin D deficiency   . Endometriosis   . Asthma   . Anxiety and depression   . Frequent headaches   . Anxiety   . Melasma     Past Surgical History  Procedure Laterality Date  . Laparoscopic ovarian cystectomy  2002  . Diagnostic laparoscopy  1999    endometriosis  . Thyroid surgery  2004    lump removed  . Abdominal hysterectomy  03/2015    done in [place]  . Oophorectomy Left 03-2015    Social History   Social History  . Marital Status: Single    Spouse Name: N/A  . Number of Children: 0  . Years of Education: N/A   Occupational History  . [employer]     Social History Main Topics  . Smoking status: Never Smoker   . Smokeless tobacco: Never Used  . Alcohol Use: No  . Drug Use: No  . Sexual Activity:    Partners: Male    Birth Control/ Protection: None   Other Topics Concern  . Not on file   Social History Narrative   Original from [place], moved to [place] ~ 2005   Divorced, lives w/ a room mate         Medication List       This list is accurate as of: 07/29/15  4:54 PM.  Always use your most recent med list.                 cyclobenzaprine 10 MG tablet  Commonly known as:  FLEXERIL  Take 1 tablet (10 mg total) by mouth at bedtime as needed for muscle spasms.     levothyroxine 112 MCG tablet  Commonly known as:  SYNTHROID, LEVOTHROID  Take 1 tablet (112 mcg total) by mouth daily before breakfast.     multivitamin tablet  Take 1 tablet by mouth daily.           Objective:   Physical Exam BP 106/68 mmHg  Pulse 67  Temp(Src) 98 F (36.7 C) (Oral)  Ht 5\' 3"  (1.6 m)  Wt 123 lb (55.792 kg)  BMI 21.79 kg/m2  SpO2 97%  LMP 02/17/2015 General:   Well developed, well nourished . NAD.  HEENT:  Normocephalic . Face symmetric, atraumatic Neck: No TTP, full range of motion. MSK: No tender at the thoracic spine Lungs:  CTA B Normal respiratory effort, no intercostal retractions, no accessory muscle use. Heart: RRR,  no murmur.  No pretibial edema bilaterally  Skin: Not pale. Not jaundice Neurologic:  alert & oriented X3.  Speech normal, gait appropriate for age and unassisted. DTRs symmetric, strength symmetric. Psych--  Cognition and  judgment appear intact.  Cooperative with normal attention span and concentration.  Behavior appropriate. No anxious or depressed appearing.      Assessment & Plan:    Assessment Hypothyroidism Anxiety depression Rosacea  Vitamin D deficiency Frequent headaches H/o asthma  H/o endometriosis, s/p hysterectomy  PLAN hypothyroidism: Refill medications, check a TSH Vitamin D deficiency: Check levels, restart OTC supplements, allows some sun exposure. Upper back pain: Stretching discuss, okay to take Tylenol or ibuprofen as needed. Flexeril at bedtime. Call if no better. RTC May 2017 ,  CPX

## 2015-07-29 NOTE — Progress Notes (Signed)
Pre visit review using our clinic review tool, if applicable. No additional management support is needed unless otherwise documented below in the visit note. 

## 2015-07-29 NOTE — Assessment & Plan Note (Signed)
hypothyroidism: Refill medications, check a TSH Vitamin D deficiency: Check levels, restart OTC supplements, allows some sun exposure. Upper back pain: Stretching discuss, okay to take Tylenol or ibuprofen as needed. Flexeril at bedtime. Call if no better. RTC May 2017 ,  CPX

## 2015-07-31 MED ORDER — VITAMIN D (ERGOCALCIFEROL) 1.25 MG (50000 UNIT) PO CAPS
50000.0000 [IU] | ORAL_CAPSULE | ORAL | Status: DC
Start: 2015-07-31 — End: 2016-06-11

## 2015-07-31 NOTE — Addendum Note (Signed)
Addended byDamita Dunnings D on: 07/31/2015 07:57 AM   Modules accepted: Orders

## 2015-08-05 ENCOUNTER — Other Ambulatory Visit: Payer: Self-pay | Admitting: Internal Medicine

## 2015-08-26 ENCOUNTER — Ambulatory Visit (INDEPENDENT_AMBULATORY_CARE_PROVIDER_SITE_OTHER): Payer: BLUE CROSS/BLUE SHIELD | Admitting: Medical

## 2015-08-26 ENCOUNTER — Telehealth: Payer: Self-pay | Admitting: Medical

## 2015-08-26 ENCOUNTER — Encounter: Payer: Self-pay | Admitting: Medical

## 2015-08-26 VITALS — BP 108/68 | HR 89 | Temp 97.8°F | Ht 63.0 in | Wt 124.0 lb

## 2015-08-26 DIAGNOSIS — R0981 Nasal congestion: Secondary | ICD-10-CM

## 2015-08-26 DIAGNOSIS — R05 Cough: Secondary | ICD-10-CM | POA: Diagnosis not present

## 2015-08-26 DIAGNOSIS — J209 Acute bronchitis, unspecified: Secondary | ICD-10-CM

## 2015-08-26 DIAGNOSIS — J4520 Mild intermittent asthma, uncomplicated: Secondary | ICD-10-CM | POA: Diagnosis not present

## 2015-08-26 DIAGNOSIS — R059 Cough, unspecified: Secondary | ICD-10-CM

## 2015-08-26 DIAGNOSIS — J029 Acute pharyngitis, unspecified: Secondary | ICD-10-CM | POA: Diagnosis not present

## 2015-08-26 MED ORDER — FLUTICASONE PROPIONATE 50 MCG/ACT NA SUSP: 2.0000 | Freq: Every day | NASAL | Status: DC

## 2015-08-26 MED ORDER — AZITHROMYCIN 250 MG PO TABS: ORAL_TABLET | ORAL | Status: DC

## 2015-08-26 MED ORDER — ALBUTEROL SULFATE HFA 108 (90 BASE) MCG/ACT IN AERS: 2.0000 | INHALATION_SPRAY | Freq: Four times a day (QID) | RESPIRATORY_TRACT | Status: DC | PRN

## 2015-08-26 MED ORDER — BENZONATATE 100 MG PO CAPS: 100.0000 mg | ORAL_CAPSULE | Freq: Three times a day (TID) | ORAL | Status: DC | PRN

## 2015-08-26 NOTE — Telephone Encounter (Signed)
Chart opened inadvertently.

## 2015-08-26 NOTE — Progress Notes (Signed)
Subjective:    Patient ID: Emily Ritter, female    DOB: 1975/08/26, 40 y.o.   MRN: [id number]  HPI  Last Tuesday pt has st. Then she lost her voice. Also has a lot nasal congestion and some itchy throat. No severe sinus pressure. Pt beginning to have cough that is productive(pt indicates she is convinced needs antibiotic). Pt has history of asthma in past. But no inhaler presently. She indicated will start to wheeze pretty soon based on historical pattern of getting bronchitis first followed by wheezing  Pt states last Tuesday some fever and chills. Bu none recently.  Last 2 days some very  Faint/minimal  mild diffuse body aches.(not not severe)    Past Medical History  Diagnosis Date  . Hypothyroidism   . Vitamin D deficiency   . Endometriosis   . Asthma   . Anxiety and depression   . Frequent headaches   . Anxiety   . Melasma     Social History   Social History  . Marital Status: Single    Spouse Name: N/A  . Number of Children: 0  . Years of Education: N/A   Occupational History  . massusse     Social History Main Topics  . Smoking status: Never Smoker   . Smokeless tobacco: Never Used  . Alcohol Use: No  . Drug Use: No  . Sexual Activity:    Partners: Male    Birth Control/ Protection: None   Other Topics Concern  . Not on file   Social History Narrative   Original from [place], moved to [place] ~ 2005   Divorced, lives w/ a room mate     Past Surgical History  Procedure Laterality Date  . Laparoscopic ovarian cystectomy  2002  . Diagnostic laparoscopy  1999    endometriosis  . Thyroid surgery  2004    lump removed  . Abdominal hysterectomy  03/2015    done in [place]  . Oophorectomy Left 03-2015    Family History  Problem Relation Age of Onset  . Hypertension Mother   . Hyperlipidemia Mother   . Hypertension Father   . Heart disease Father     onset age 37, stent , CABG  . Hyperlipidemia Father   . Uterine cancer Paternal Aunt   . Colon  cancer Neg Hx   . Breast cancer Neg Hx     Allergies  Allergen Reactions  . Penicillins Other (See Comments)    Unknown  . Amoxicillin Rash    Current Outpatient Prescriptions on File Prior to Visit  Medication Sig Dispense Refill  . cyclobenzaprine (FLEXERIL) 10 MG tablet Take 1 tablet (10 mg total) by mouth at bedtime as needed for muscle spasms. 21 tablet 0  . levothyroxine (SYNTHROID, LEVOTHROID) 112 MCG tablet Take 1 tablet (112 mcg total) by mouth daily before breakfast. 30 tablet 6  . Multiple Vitamin (MULTIVITAMIN) tablet Take 1 tablet by mouth daily.    . Vitamin D, Ergocalciferol, (DRISDOL) 50000 units CAPS capsule Take 1 capsule (50,000 Units total) by mouth every 7 (seven) days. 12 capsule 0   No current facility-administered medications on file prior to visit.    BP 108/68 mmHg  Pulse 89  Temp(Src) 97.8 F (36.6 C) (Oral)  Ht 5\' 3"  (1.6 m)  Wt 124 lb (56.246 kg)  BMI 21.97 kg/m2  SpO2 98%  LMP 02/17/2015     Review of Systems  Constitutional: Positive for chills. Negative for fever and fatigue.  HENT:  Positive for congestion and sore throat. Negative for dental problem, ear pain, hearing loss, postnasal drip, sinus pressure, sneezing and tinnitus.   Respiratory: Positive for cough. Negative for shortness of breath and wheezing.        Prodcutive and pt is concerned that she may in very near future start to wheeze based on her history.  Cardiovascular: Negative for chest pain and palpitations.  Gastrointestinal: Negative for abdominal pain.  Musculoskeletal: Positive for myalgias. Negative for back pain.  Neurological: Negative for dizziness.  Hematological: Negative for adenopathy. Does not bruise/bleed easily.  Psychiatric/Behavioral: Negative for behavioral problems and confusion.   Past Medical History  Diagnosis Date  . Hypothyroidism   . Vitamin D deficiency   . Endometriosis   . Asthma   . Anxiety and depression   . Frequent headaches   .  Anxiety   . Melasma     Social History   Social History  . Marital Status: Single    Spouse Name: N/A  . Number of Children: 0  . Years of Education: N/A   Occupational History  . massusse     Social History Main Topics  . Smoking status: Never Smoker   . Smokeless tobacco: Never Used  . Alcohol Use: No  . Drug Use: No  . Sexual Activity:    Partners: Male    Birth Control/ Protection: None   Other Topics Concern  . Not on file   Social History Narrative   Original from [place], moved to [place] ~ 2005   Divorced, lives w/ a room mate     Past Surgical History  Procedure Laterality Date  . Laparoscopic ovarian cystectomy  2002  . Diagnostic laparoscopy  1999    endometriosis  . Thyroid surgery  2004    lump removed  . Abdominal hysterectomy  03/2015    done in [place]  . Oophorectomy Left 03-2015    Family History  Problem Relation Age of Onset  . Hypertension Mother   . Hyperlipidemia Mother   . Hypertension Father   . Heart disease Father     onset age 57, stent , CABG  . Hyperlipidemia Father   . Uterine cancer Paternal Aunt   . Colon cancer Neg Hx   . Breast cancer Neg Hx     Allergies  Allergen Reactions  . Penicillins Other (See Comments)    Unknown  . Amoxicillin Rash    Current Outpatient Prescriptions on File Prior to Visit  Medication Sig Dispense Refill  . cyclobenzaprine (FLEXERIL) 10 MG tablet Take 1 tablet (10 mg total) by mouth at bedtime as needed for muscle spasms. 21 tablet 0  . levothyroxine (SYNTHROID, LEVOTHROID) 112 MCG tablet Take 1 tablet (112 mcg total) by mouth daily before breakfast. 30 tablet 6  . Multiple Vitamin (MULTIVITAMIN) tablet Take 1 tablet by mouth daily.    . Vitamin D, Ergocalciferol, (DRISDOL) 50000 units CAPS capsule Take 1 capsule (50,000 Units total) by mouth every 7 (seven) days. 12 capsule 0   No current facility-administered medications on file prior to visit.    BP 108/68 mmHg  Pulse 89  Temp(Src)  97.8 F (36.6 C) (Oral)  Ht 5\' 3"  (1.6 m)  Wt 124 lb (56.246 kg)  BMI 21.97 kg/m2  SpO2 98%  LMP 02/17/2015       Objective:   Physical Exam  General  Mental Status - Alert. General Appearance - Well groomed. Not in acute distress.  Skin Rashes- No Rashes.  HEENT  Head- Normal. Ear Auditory Canal - Left- Normal. Right - Normal.Tympanic Membrane- Left- Normal. Right- Normal. Eye Sclera/Conjunctiva- Left- Normal. Right- Normal. Nose & Sinuses Nasal Mucosa- Left-  Boggy and Congested. Right-  Boggy and  Congested.Bilateral maxillary and frontal sinus pressure. Mouth & Throat Lips: Upper Lip- Normal: no dryness, cracking, pallor, cyanosis, or vesicular eruption. Lower Lip-Normal: no dryness, cracking, pallor, cyanosis or vesicular eruption. Buccal Mucosa- Bilateral- No Aphthous ulcers. Oropharynx- No Discharge or Erythema. +pond Tonsils: Characteristics- Bilateral- moderate to severe  Erythema and  Congestion. Size/Enlargement- Bilateral- 1-2+ enlargement. Discharge- bilateral-None.  Neck Neck- Supple. No Masses.   Chest and Lung Exam Auscultation: Breath Sounds:-Clear even and unlabored.  Cardiovascular Auscultation:Rythm- Regular, rate and rhythm. Murmurs & Other Heart Sounds:Ausculatation of the heart reveal- No Murmurs.  Lymphatic Head & Neck General Head & Neck Lymphatics: Bilateral: Description- No Localized lymphadenopathy.       Assessment & Plan:  For you pharyngitis by exam and bronchitis will rx azithromycin.  For nasal congestion flonase.  For cough benzonatate.  For any wheezing albuterol.  Follow up in 7 days or as needed   Note her overall presentation and degree of illness did not see flu like so did not to rapid test.

## 2015-08-26 NOTE — Patient Instructions (Addendum)
For you pharyngitis by exam and bronchitis will rx azithromycin.  For nasal congestion flonase.  For cough benzonatate.  For any wheezing albuterol.  Follow up in 7 days or as needed

## 2015-08-26 NOTE — Progress Notes (Signed)
Pre visit review using our clinic review tool, if applicable. No additional management support is needed unless otherwise documented below in the visit note. 

## 2015-09-04 ENCOUNTER — Encounter: Payer: BLUE CROSS/BLUE SHIELD | Admitting: Gynecology

## 2015-10-14 ENCOUNTER — Ambulatory Visit (INDEPENDENT_AMBULATORY_CARE_PROVIDER_SITE_OTHER): Payer: BLUE CROSS/BLUE SHIELD | Admitting: Gynecology

## 2015-10-14 ENCOUNTER — Encounter: Payer: Self-pay | Admitting: Gynecology

## 2015-10-14 VITALS — BP 110/70 | Ht 62.5 in | Wt 122.0 lb

## 2015-10-14 DIAGNOSIS — Z90710 Acquired absence of both cervix and uterus: Secondary | ICD-10-CM | POA: Diagnosis not present

## 2015-10-14 DIAGNOSIS — D251 Intramural leiomyoma of uterus: Secondary | ICD-10-CM | POA: Diagnosis not present

## 2015-10-14 DIAGNOSIS — A499 Bacterial infection, unspecified: Secondary | ICD-10-CM | POA: Diagnosis not present

## 2015-10-14 DIAGNOSIS — N898 Other specified noninflammatory disorders of vagina: Secondary | ICD-10-CM

## 2015-10-14 DIAGNOSIS — B9689 Other specified bacterial agents as the cause of diseases classified elsewhere: Secondary | ICD-10-CM

## 2015-10-14 DIAGNOSIS — Z8742 Personal history of other diseases of the female genital tract: Secondary | ICD-10-CM | POA: Diagnosis not present

## 2015-10-14 DIAGNOSIS — N760 Acute vaginitis: Secondary | ICD-10-CM

## 2015-10-14 LAB — WET PREP FOR TRICH, YEAST, CLUE
CLUE CELLS WET PREP: NONE SEEN
Trich, Wet Prep: NONE SEEN
Yeast Wet Prep HPF POC: NONE SEEN

## 2015-10-14 MED ORDER — METRONIDAZOLE 0.75 % VA GEL: 1.0000 | Freq: Two times a day (BID) | VAGINAL | Status: DC

## 2015-10-14 NOTE — Progress Notes (Signed)
HPI:  Is a 40 year old gravida 0 who was seen in the office back in August 2016. Patient today was complaining of a vaginal discharge and also brought documentation from: [place] where she recently had a total abdominal hysterectomy with right salpingo-oophorectomy and left salpingectomy as a result of her severe endometriosis. Review of my last encounter with her and previous visits and ultrasounds were rediscussed as follows:  My notes from 2012 and indicated the following: "Record and indicated that in 1999 she had a diagnostic laparoscopy and was diagnosed with endometriosis of the right ovary in [place]. In 2002 she had another laparoscopy and had a laparoscopic left ovary and cystectomy also in [place]. She suffers from dyspareunia especially the right lower abdomen during intercourse at first she stated it did not hurt any other times of the month but now it's more frequent. She also complains of brownish discharge in between her periods. She is now using any form of contraception. Menstrual cycles report to be normal 35 day duration. Patient has a history of hypothyroidism for which she is on Synthroid 100 mcg daily. She's had history vitamin D deficiency in the past and is currently taking vitamin D 3 2000 units daily.  Patient had an ultrasound in the office back in December of 2010 she has 5 small fibroids and had a left ovarian thinwall vascular complex mass measuring 48 x 40 x 43 mm consistent with endometrioma and slightly increased over the course of 2 years.Followup ultrasound today and sonohysterogram: Uterus measured 8.9 x 6.0 x 4.4 cm with an endometrial stripe of 7.9 mm for small fibroids were noted largest one measuring 23 x 16 mm slightly indenting the uterine cavity. Right ovary was normal the left ovary complex thin-walled avascular cysts once again was noted measure 47 x 37 mm. Sonohysterogram with no evidence of intracavitary  defects."  Ultrasound 02/11/2015: Uterus measured 10.0 x 7.6 x 5.6 cm with endometrial stripe of 8.4 mm. Several intramural and subserous myoma with following measurements: 30 x 18 mm, 21 mm, 22 mm, 20 mm, 13 mm, 70 mm, 16 mm, right ovary tubular-shaped cystic mass measuring 4.7 x 2.5 cm was noted, thinwall solid cystic mass 3.6 x 2.5 cm both negative color flow. Left ovary was thinwall cystic mass diffuse homogeneous low level echoes measuring 4.2 x 3.9 x 3.8 cm and 3.3 x 3.8 cm both negative color flow no fluid in the cul-de-sac. Arterial blood flow was noted to the right and left ovary.  Patient's surgery in [place] was October 2016.   ROS: A ROS was performed and pertinent positives and negatives are included in the history.  GENERAL: No fevers or chills. HEENT: No change in vision, no earache, sore throat or sinus congestion. NECK: No pain or stiffness. CARDIOVASCULAR: No chest pain or pressure. No palpitations. PULMONARY: No shortness of breath, cough or wheeze. GASTROINTESTINAL: No abdominal pain, nausea, vomiting or diarrhea, melena or bright red blood per rectum. GENITOURINARY: No urinary frequency, urgency, hesitancy or dysuria, patient reports a clear-like discharge with some slight fishy odor MUSCULOSKELETAL: No joint or muscle pain, no back pain, no recent trauma. DERMATOLOGIC: No rash, no itching, no lesions. ENDOCRINE: No polyuria, polydipsia, no heat or cold intolerance. No recent change in weight. HEMATOLOGICAL: No anemia or easy bruising or bleeding. NEUROLOGIC: No headache, seizures, numbness, tingling or weakness. PSYCHIATRIC: No depression, no loss of interest in normal activity or change in sleep pattern.   PE: Gen. appearance  well-developed well nourished female with the above-mentioned complaint Abdomen: Pfannenstiel scar completely healed. Abdomen soft nontender no rebound or guarding Pelvic: Bartholin urethra Skene was within normal limits Vagina: Yellowish like  discharge was noted with slight fishy odor. Vaginal cuff intact Bimanual exam no palpable masses or tenderness Rectal exam: Not done  Wet prep tumors to count white blood cells and moderate bacteria  I have recently reviewed in [place] patient's hospitalization as well as total abdominal hysterectomy with left salpingo-oophorectomy and right salpingectomy as a result of pelvic pain attributed to her endometriosis and pelvic adhesions. The pathology report had stated the following: Uterine fibroids, nabothian cyst seen at the cervix with metaplasia and chronic cervicitis. Benign right fallopian tube from salpingectomy, left tube and ovary hemorrhagic corpus luteum cyst and left benign fallopian tube.    Assessment Plan: Patient status post total abdominal hysterectomy with left salpingo-oophorectomy right salpingectomy as a result of endometriosis and chronic pelvic pain and [place] in October 2016 with benign pathology as described above. Patient has done well from her surgery otherwise. Her wet prep today demonstrated evidence of bacterial vaginosis for which she will be prescribed MetroGel vaginal cream to apply intravaginally twice a day for 5-7 days. She's otherwise scheduled to return to the office at the end of the year for her annual exam. She was provided with a requisition for baseline mammogram.  Greater than 50% of time was spent in counseling and coordinating care of this patient. Time of consultation: 15   Minutes.

## 2015-10-14 NOTE — Patient Instructions (Signed)
Metronidazole vaginal gel Qu es este medicamento? El gel vaginal de METRONIDAZOL es un antiinfeccioso. Se utiliza para tratar la vaginitis bacteriana. Este medicamento puede ser utilizado para otros usos; si tiene alguna pregunta consulte con su proveedor de atencin mdica o con su farmacutico. Qu le debo informar a mi profesional de la salud antes de tomar este medicamento? Necesita saber si usted presenta alguno de los siguientes problemas o situaciones: -consume bebidas alcohlicas -si ha tomando disulfiram en las dos ltimas semanas -enfermedad heptica -neuropata perifrica -convulsiones -una reaccin alrgica o inusual al metronidazol, parabenos, nitroimidazoles, a otros medicamentos, alimentos, colorantes o conservantes -si est embarazada o buscando quedar embarazada -si est amamantando a un beb Cmo debo utilizar este medicamento? Este medicamento es para Risk manager solamente en la vagina. No lo tome por va oral ni lo aplique sobre otras reas del cuerpo. Siga las instrucciones de la etiqueta del [place]. Lvese las manos antes y despus de usarlo. Enrosque el aplicador al tubo y comprima el tubo suavemente para Geophysical data processor. Recustese de espaldas, separe y flexione las rodillas. Introduzca el extremo del aplicador de modo que penetre profundamente en la vagina y empuje el mbolo para que el gel ingrese en la vagina. [place], retire Designer, television/film set. Lave bien el aplicador con agua caliente y [place]. Utilcelo a intervalos regulares. Complete todo el tratamiento con el medicamento segn lo haya recetado su mdico o su profesional de la salud aun si considera que su problema ha mejorado. No interrumpa su uso excepto si as lo indica su mdico o su profesional de [employer]	[place]. Hable con su pediatra para informarse acerca del uso de este medicamento en nios. Puede requerir atencin especial. Sobredosis: Pngase en contacto inmediatamente con un centro toxicolgico o una  sala de urgencia si usted cree que haya tomado demasiado medicamento. ATENCIN: [employer] es solo para usted. No comparta este medicamento con nadie. Qu sucede si me olvido de una dosis? Si olvida una dosis, aplquela lo antes posible. Si es casi la hora de la prxima dosis, aplique slo esa dosis. No utilice dosis dobles o adicionales. Qu puede interactuar con este medicamento? No tome esta medicina con ninguno de los siguientes medicamentos: -alcohol o cualquier producto que contiene alcohol -cisapride -dofetilida -dronedarona -pimozida -tioridazina -ziprasidona Esta medicina tambin puede interactuar con los siguientes medicamentos: -cimetidina -litio -otros medicamentos que prolongan el intervalo QT (provoca un ritmo cardiaco anormal) -warfarina Puede ser que esta lista no menciona todas las posibles interacciones. Informe a su profesional de KB Home	[place] de [employer] productos a base de hierbas, medicamentos de [place] o suplementos nutritivos que est tomando. Si usted fuma, consume bebidas alcohlicas o si utiliza drogas ilegales, indqueselo tambin a su profesional de KB [employer]	[place]. Algunas sustancias pueden interactuar con su medicamento. A qu debo estar atento al usar [employer]? Si los sntomas no mejoran a los 2  3 das, consulte con su mdico o con su profesional de KB Home	[place]. Evite las bebidas alcohlicas durante el tratamiento con este medicamento y [employer] tres das siguientes. El alcohol puede causarle mareos, hacerlo sentir enfermo y ruborizado. Puede experimentar somnolencia o mareos. No conduzca ni utilice maquinaria ni haga nada que Associate Professor en estado de alerta hasta que sepa cmo le afecta este medicamento. Para reducir el riesgo de mareos o [place], no se siente ni se ponga de pie con rapidez, especialmente si es un paciente de edad avanzada. Su ropa puede ensuciarse si tiene secreciones vaginales. Puede usar una toallita higinica.  No use  tampones. Use ropa interior recin lavadas de algodn, no use las sintticas. No tenga relaciones sexuales hasta que haya completado el tratamiento. Las [employer] pueden reducir la efectividad del [place]. Es posible que su pareja tambin necesite [place]. Qu efectos secundarios puedo tener al [employer] este medicamento? Efectos secundarios que debe informar a su mdico o a Barrister's clerk de la salud tan pronto como sea posible: -Tree surgeon -frecuentes descargas de orina -dolor de cabeza -prdida del apetito -nuseas -erupcin cutnea, picazn -calambres o dolores estomacales -secrecin o irritacin vaginal -ardor o hinchazn de la vulva Efectos secundarios que, por lo general, no requieren atencin mdica (debe informarlos a su mdico o a su profesional de la salud si persisten o si son molestos): -orina de color amarillo oscuro -ardor vaginal leve Puede ser que esta lista no menciona todos los posibles efectos secundarios. Comunquese a su mdico por asesoramiento mdico [employer]. Usted puede informar los efectos secundarios a la FDA por telfono al 1-800-FDA-1088. Dnde debo guardar mi medicina? Mantngala fuera del alcance de los nios. [place]dela a [employer], entre 15 y 47 grados C (67 y 108 grados F). No la congele. Deseche todo el medicamento que no haya utilizado, despus de la fecha de vencimiento. ATENCIN: Este folleto es un resumen. Puede ser que no cubra toda la posible informacin. Si usted tiene preguntas acerca de esta medicina, consulte con su mdico, su farmacutico o su profesional de Technical sales engineer.    2016, Elsevier/Gold Standard. (2014-07-24 00:00:00) Vaginosis bacteriana (Bacterial Vaginosis) La vaginosis bacteriana es una infeccin vaginal que perturba el equilibrio normal de las bacterias que se encuentran en la vagina. Es el resultado de un crecimiento excesivo de ciertas bacterias. Esta es la infeccin vaginal ms  frecuente en mujeres en edad reproductiva. El tratamiento es importante para prevenir complicaciones, especialmente en mujeres embarazadas, dado que puede causar un parto prematuro. CAUSAS  La vaginosis bacteriana se origina por un aumento de bacterias nocivas que, generalmente, estn presentes en cantidades ms pequeas en la vagina. Varios tipos diferentes de bacterias pueden causar esta afeccin. Sin embargo, la causa de su desarrollo no se comprende totalmente. [place] o comportamientos pueden exponerlo a un mayor riesgo de desarrollar vaginosis bacteriana, entre los que se incluyen:  Tener una nueva pareja sexual o mltiples parejas sexuales.  Las duchas vaginales  El uso del DIU (dispositivo intrauterino) como mtodo anticonceptivo. El contagio no se produce en baos, por ropas de cama, en piscinas o por contacto con objetos. SIGNOS Y SNTOMAS  Algunas mujeres que padecen vaginosis bacteriana no presentan signos ni sntomas. Los sntomas ms comunes son:  Secrecin vaginal de color grisceo.  Secrecin vaginal con olor similar al [employer], especialmente despus de Retail banker.  Picazn o sensacin de ardor en la vagina o la vulva.  Ardor o dolor al [employer]. DIAGNSTICO  Su mdico analizar su historia clnica y le examinar la vagina para detectar signos de vaginosis bacteriana. Puede tomarle Truddie Coco de flujo vaginal. Su mdico examinar esta muestra con un microscopio para controlar las bacterias y clulas anormales. Tambin puede realizarse un anlisis del pH vaginal.  TRATAMIENTO  La vaginosis bacteriana puede tratarse con antibiticos, en forma de comprimidos o de crema vaginal. Puede indicarse una segunda tanda de antibiticos si la afeccin se repite despus del tratamiento. Debido a que la vaginosis bacteriana aumenta el riesgo de contraer enfermedades de transmisin sexual, el tratamiento puede ayudar a reducir el riesgo de  clamidia,  gonorrea, VIH y herpes. [place] solo medicamentos de venta libre o recetados, segn las indicaciones del mdico.  Si le han recetado antibiticos, tmelos como se le indic. Asegrese de que finaliza la prescripcin completa aunque se sienta mejor.  Comunique a sus compaeros sexuales que sufre una infeccin vaginal. Deben consultar a su mdico y recibir tratamiento si tienen problemas, como picazn o una erupcin cutnea leve.  Durante el [place], es importante que siga estas indicaciones:  Visual merchandiser relaciones sexuales o use preservativos de la forma correcta.  No se haga duchas vaginales.  Evite consumir alcohol como se lo haya indicado el mdico.  Community education officer se lo haya indicado el mdico. SOLICITE ATENCIN MDICA SI:   Sus sntomas no mejoran despus de 3 das de [place].  Aumenta la secrecin o Conservation officer, historic buildings.  Tiene fiebre. ASEGRESE DE QUE:   Comprende estas instrucciones.  Controlar su afeccin.  Recibir ayuda de inmediato si no mejora o si empeora. PARA OBTENER MS INFORMACIN  Centros para el control y la prevencin de Probation officer for Disease Control and Prevention, CDC): AppraiserFraud.fi Asociacin Estadounidense de la Salud Sexual (American Sexual Health Association, SHA): www.ashastd.org    Esta informacin no tiene Marine scientist el consejo del mdico. Asegrese de hacerle al mdico cualquier pregunta que tenga.   Document Released: 09/08/2007 Document Revised: 06/22/2014 Elsevier Interactive Patient Education [employer].

## 2015-10-15 ENCOUNTER — Ambulatory Visit (INDEPENDENT_AMBULATORY_CARE_PROVIDER_SITE_OTHER): Payer: BLUE CROSS/BLUE SHIELD | Admitting: Internal Medicine

## 2015-10-15 ENCOUNTER — Ambulatory Visit (HOSPITAL_BASED_OUTPATIENT_CLINIC_OR_DEPARTMENT_OTHER)
Admission: RE | Admit: 2015-10-15 | Discharge: 2015-10-15 | Disposition: A | Discharge status: Home / Self Care | Payer: BLUE CROSS/BLUE SHIELD | Source: Ambulatory Visit | Attending: Internal Medicine | Admitting: Internal Medicine

## 2015-10-15 ENCOUNTER — Encounter: Payer: Self-pay | Admitting: Internal Medicine

## 2015-10-15 VITALS — BP 90/60 | HR 68 | Temp 98.0°F | Ht 62.5 in | Wt 124.2 lb

## 2015-10-15 DIAGNOSIS — M47814 Spondylosis without myelopathy or radiculopathy, thoracic region: Secondary | ICD-10-CM | POA: Diagnosis not present

## 2015-10-15 DIAGNOSIS — E559 Vitamin D deficiency, unspecified: Secondary | ICD-10-CM | POA: Diagnosis not present

## 2015-10-15 DIAGNOSIS — M546 Pain in thoracic spine: Secondary | ICD-10-CM

## 2015-10-15 DIAGNOSIS — Z09 Encounter for follow-up examination after completed treatment for conditions other than malignant neoplasm: Secondary | ICD-10-CM

## 2015-10-15 DIAGNOSIS — Z0001 Encounter for general adult medical examination with abnormal findings: Secondary | ICD-10-CM | POA: Diagnosis not present

## 2015-10-15 DIAGNOSIS — K4090 Unilateral inguinal hernia, without obstruction or gangrene, not specified as recurrent: Secondary | ICD-10-CM

## 2015-10-15 DIAGNOSIS — R6889 Other general symptoms and signs: Secondary | ICD-10-CM | POA: Diagnosis not present

## 2015-10-15 DIAGNOSIS — Z0000 Encounter for general adult medical examination without abnormal findings: Secondary | ICD-10-CM

## 2015-10-15 DIAGNOSIS — R1031 Right lower quadrant pain: Secondary | ICD-10-CM | POA: Diagnosis not present

## 2015-10-15 DIAGNOSIS — M4184 Other forms of scoliosis, thoracic region: Secondary | ICD-10-CM | POA: Diagnosis not present

## 2015-10-15 MED ORDER — AZELASTINE HCL 0.05 % OP SOLN: 1.0000 [drp] | Freq: Two times a day (BID) | OPHTHALMIC | Status: DC

## 2015-10-15 MED ORDER — ALBUTEROL SULFATE HFA 108 (90 BASE) MCG/ACT IN AERS
2.0000 | INHALATION_SPRAY | Freq: Four times a day (QID) | RESPIRATORY_TRACT | Status: DC | PRN
Start: 2015-10-15 — End: 2016-06-11

## 2015-10-15 NOTE — Assessment & Plan Note (Signed)
Td~ 2008   Had a Cscope ~ July 2014, dx w/ internal hemorrhoids DR Ardis Hughs S/p hysterectomy, no malignancy per pt, had endometriosis ,  Never had a MMG Diet and exercise discussed Labs: BMP, FLP, CBC

## 2015-10-15 NOTE — Patient Instructions (Signed)
GO TO THE LAB :      Get the blood work     GO TO THE FRONT DESK schedule your next appointment for a  checkup in 4 months, no fasting    STOP BY THE FIRST FLOOR:  get the XR    Start taking OTC vitamin D tablet between 600 units an 1000 units every day

## 2015-10-15 NOTE — Progress Notes (Signed)
Subjective:    Patient ID: Emily Ritter, female    DOB: 1976-01-19, 40 y.o.   MRN: [id number]  DOS:  10/15/2015 Type of visit - description : CPX Interval history:  Has a number of concerns, see review of systems   Review of Systems  Constitutional: No fever. No chills. No unexplained wt changes. No unusual sweats  HEENT: No dental problems, no ear discharge, no facial swelling, no voice changes. No eye discharge, but reports persistent bilateral itchiness and redness , B eyes, symptoms are usually seasonal  Respiratory: Recently  seen with wheezing, improved.  , no  difficulty breathing. No cough , no mucus production  Cardiovascular: No CP, no leg swelling , no  Palpitations  GI: no nausea, no vomiting, no diarrhea, having pain at the right suprapubic area since the hysterectomy 03-2015  No blood in the stools. No dysphagia, no odynophagia    Endocrine: No polyphagia, no polyuria , no polydipsia  GU: No dysuria, gross hematuria, difficulty urinating. No urinary urgency, no frequency.  Musculoskeletal: No joint swellings ; previously was seen with neck pain and thoracic pain, neck pain is gone, still have thoracic pain.  Also complaining of a hurting at the left popliteal area Skin: No change in the color of the skin, palor , no  Rash  Allergic, immunologic: No environmental allergies , no  food allergies  Neurological: No dizziness no  syncope. No headaches. No diplopia, no slurred, no slurred speech, no motor deficits, no facial  Numbness  Hematological: No enlarged lymph nodes, no easy bruising , no unusual bleedings  Psychiatry: No suicidal ideas, no hallucinations, no beavior problems, no confusion.  No unusual/severe anxiety, no depression    Past Medical History  Diagnosis Date  . Hypothyroidism   . Vitamin D deficiency   . Endometriosis   . Asthma   . Anxiety and depression   . Frequent headaches   . Anxiety   . Melasma     Past Surgical History    Procedure Laterality Date  . Laparoscopic ovarian cystectomy  2002  . Diagnostic laparoscopy  1999    endometriosis  . Thyroid surgery  2004    lump removed  . Abdominal hysterectomy  03/2015    done in [place], [place] has R ovary  . Oophorectomy Left 03-2015    Social History   Social History  . Marital Status: Single    Spouse Name: N/A  . Number of Children: 0  . Years of Education: N/A   Occupational History  . [employer]     Social History Main Topics  . Smoking status: Never Smoker   . Smokeless tobacco: Never Used  . Alcohol Use: No  . Drug Use: No  . Sexual Activity:    Partners: Male    Birth Control/ Protection: None   Other Topics Concern  . Not on file   Social History Narrative   Original from [place], moved to [place] ~ 2005   Divorced, lives w/ a room mate         Medication List       This list is accurate as of: 10/15/15 11:59 PM.  Always use your most recent med list.               albuterol 108 (90 Base) MCG/ACT inhaler  Commonly known as:  PROVENTIL HFA;VENTOLIN HFA  Inhale 2 puffs into the lungs every 6 (six) hours as needed for wheezing or shortness of breath.  azelastine 0.05 % ophthalmic solution  Commonly known as:  OPTIVAR  Place 1 drop into both eyes 2 (two) times daily.     fluticasone 50 MCG/ACT nasal spray  Commonly known as:  FLONASE  Place 2 sprays into both nostrils daily.     levothyroxine 112 MCG tablet  Commonly known as:  SYNTHROID, LEVOTHROID  Take 1 tablet (112 mcg total) by mouth daily before breakfast.     metroNIDAZOLE 0.75 % vaginal gel  Commonly known as:  METROGEL  Place 1 Applicatorful vaginally 2 (two) times daily.     multivitamin tablet  Take 1 tablet by mouth daily.     Vitamin D (Ergocalciferol) 50000 units Caps capsule  Commonly known as:  DRISDOL  Take 1 capsule (50,000 Units total) by mouth every 7 (seven) days.           Objective:   Physical Exam  Abdominal:     BP 90/60 mmHg   Pulse 68  Temp(Src) 98 F (36.7 C) (Oral)  Ht 5' 2.5" (1.588 m)  Wt 124 lb 3.2 oz (56.337 kg)  BMI 22.34 kg/m2  SpO2 99%  LMP 02/17/2015  General:   Well developed, well nourished . NAD.  Neck: No  Thyromegaly  HEENT:  Normocephalic . Face symmetric, atraumatic. Eyes normal. Lungs:  CTA B Normal respiratory effort, no intercostal retractions, no accessory muscle use. Heart: RRR,  no murmur.  No pretibial edema bilaterally  Abdomen:  Not distended, soft, non-tender. No rebound or rigidity.  At the suprapubic area she does feel slightly tender to palpation. Question of hernia with valsalva, inguinal hernia area per se normal. MSK: Knees normal, left popliteal area slightly full. Skin: Exposed areas without rash. Not pale. Not jaundice Neurologic:  alert & oriented X3.  Speech normal, gait appropriate for age and unassisted Strength symmetric and appropriate for age.  Psych: Cognition and judgment appear intact.  Cooperative with normal attention span and concentration.  Behavior appropriate. No anxious or depressed appearing.    Assessment & Plan:   Assessment Hypothyroidism Anxiety depression Rosacea  Vitamin D deficiency Frequent headaches H/o asthma  H/o endometriosis, s/p hysterectomy, still has R ovary  PLAN Hypothyroidism: Continue Synthroid, recheck on return to the office Vitamin D deficiency: To finish ergocalciferol soon, recommend to start daily OTC Pain, suprapubic area: Sx started after a hysterectomy 03-2015. Question of hernia, refer to surgery Mild fullness left popliteal area --observation for now Thoracic spine pain: XR, refer to PT Allergic conjunctivitis: See description of symptoms, trial with Optivar. RTC 4 months    In addition of CPX, I spent more than 20 minutes managing other problems, see plan.

## 2015-10-15 NOTE — Progress Notes (Signed)
Pre visit review using our clinic review tool, if applicable. No additional management support is needed unless otherwise documented below in the visit note. 

## 2015-10-16 LAB — BASIC METABOLIC PANEL
BUN: 17 mg/dL (ref 6–23)
CHLORIDE: 102 meq/L (ref 96–112)
CO2: 29 meq/L (ref 19–32)
CREATININE: 0.71 mg/dL (ref 0.40–1.20)
Calcium: 9.7 mg/dL (ref 8.4–10.5)
GFR: 96.43 mL/min (ref >60.00)
GLUCOSE: 77 mg/dL (ref 70–99)
Potassium: 4 mEq/L (ref 3.5–5.1)
SODIUM: 139 meq/L (ref 135–145)

## 2015-10-16 LAB — LIPID PANEL
CHOL/HDL RATIO: 3
Cholesterol: 199 mg/dL (ref 0–200)
HDL: 62 mg/dL (ref >39.00)
LDL CALC: 116 mg/dL — AB (ref 0–99)
NonHDL: 137.27
TRIGLYCERIDES: 108 mg/dL (ref 0.0–149.0)
VLDL: 21.6 mg/dL (ref 0.0–40.0)

## 2015-10-16 LAB — CBC WITH DIFFERENTIAL/PLATELET
BASOS PCT: 0.1 % (ref 0.0–3.0)
Basophils Absolute: 0 10*3/uL (ref 0.0–0.1)
Eosinophils Absolute: 0.3 10*3/uL (ref 0.0–0.7)
Eosinophils Relative: 4.4 % (ref 0.0–5.0)
HEMATOCRIT: 37.8 % (ref 36.0–46.0)
Hemoglobin: 12.8 g/dL (ref 12.0–15.0)
LYMPHS ABS: 2.1 10*3/uL (ref 0.7–4.0)
LYMPHS PCT: 29.6 % (ref 12.0–46.0)
MCHC: 33.7 g/dL (ref 30.0–36.0)
MCV: 88.7 fl (ref 78.0–100.0)
MONOS PCT: 1.3 % — AB (ref 3.0–12.0)
Monocytes Absolute: 0.1 10*3/uL (ref 0.1–1.0)
NEUTROS ABS: 4.6 10*3/uL (ref 1.4–7.7)
Neutrophils Relative %: 64.6 % (ref 43.0–77.0)
PLATELETS: 390 10*3/uL (ref 150.0–400.0)
RBC: 4.26 Mil/uL (ref 3.87–5.11)
RDW: 13.4 % (ref 11.5–15.5)
WBC: 7.1 10*3/uL (ref 4.0–10.5)

## 2015-10-16 NOTE — Assessment & Plan Note (Signed)
Hypothyroidism: Continue Synthroid, recheck on return to the office Vitamin D deficiency: To finish ergocalciferol soon, recommend to start daily OTC Pain, suprapubic area: Sx started after a hysterectomy 03-2015. Question of hernia, refer to surgery Mild fullness left popliteal area --observation for now Thoracic spine pain: XR, refer to PT Allergic conjunctivitis: See description of symptoms, trial with Optivar. RTC 4 months

## 2015-10-18 ENCOUNTER — Encounter: Payer: Self-pay | Admitting: Gynecology

## 2016-02-18 ENCOUNTER — Ambulatory Visit: Payer: BLUE CROSS/BLUE SHIELD | Admitting: Internal Medicine

## 2016-03-29 ENCOUNTER — Other Ambulatory Visit: Payer: Self-pay | Admitting: Internal Medicine

## 2016-05-18 ENCOUNTER — Ambulatory Visit (INDEPENDENT_AMBULATORY_CARE_PROVIDER_SITE_OTHER): Payer: BLUE CROSS/BLUE SHIELD | Admitting: Gynecology

## 2016-05-18 ENCOUNTER — Encounter: Payer: Self-pay | Admitting: Gynecology

## 2016-05-18 VITALS — BP 122/86 | Ht 62.5 in | Wt 133.0 lb

## 2016-05-18 DIAGNOSIS — Z8639 Personal history of other endocrine, nutritional and metabolic disease: Secondary | ICD-10-CM

## 2016-05-18 DIAGNOSIS — Z01411 Encounter for gynecological examination (general) (routine) with abnormal findings: Secondary | ICD-10-CM

## 2016-05-18 DIAGNOSIS — E038 Other specified hypothyroidism: Secondary | ICD-10-CM

## 2016-05-18 DIAGNOSIS — Z8742 Personal history of other diseases of the female genital tract: Secondary | ICD-10-CM | POA: Diagnosis not present

## 2016-05-18 DIAGNOSIS — R102 Pelvic and perineal pain: Secondary | ICD-10-CM

## 2016-05-18 DIAGNOSIS — N898 Other specified noninflammatory disorders of vagina: Secondary | ICD-10-CM | POA: Diagnosis not present

## 2016-05-18 LAB — URINALYSIS W MICROSCOPIC + REFLEX CULTURE
BACTERIA UA: NONE SEEN [HPF]
BILIRUBIN URINE: NEGATIVE
Casts: NONE SEEN [LPF]
Crystals: NONE SEEN [HPF]
GLUCOSE, UA: NEGATIVE
Hgb urine dipstick: NEGATIVE
Ketones, ur: NEGATIVE
LEUKOCYTES UA: NEGATIVE
NITRITE: NEGATIVE
PH: 6 (ref 5.0–8.0)
PROTEIN: NEGATIVE
SPECIFIC GRAVITY, URINE: 1.015 (ref 1.001–1.035)
WBC UA: NONE SEEN WBC/HPF (ref <=5)
YEAST: NONE SEEN [HPF]

## 2016-05-18 MED ORDER — LEVOTHYROXINE SODIUM 112 MCG PO TABS: 112.0000 ug | ORAL_TABLET | Freq: Every day | ORAL | 11 refills | Status: DC

## 2016-05-18 NOTE — Progress Notes (Signed)
Emily Ritter 1975-08-24 [id number]   History:    40 y.o.  for annual gyn exam with a complaint of right lower quadrant discomfort on and off. Also some dyspareunia times. Also discomfort during intercourse which uterus and some dryness. Patient October 2016 and [place] underwent a total abdominal hysterectomy with left salpingo-oophorectomy and right salpingectomy as a result of fibroid uterus and bilateral endometriomas. Patient has not had her mammogram as of yet. She also has had history vitamin D deficiency in the past he was treated and she's currently taking 2000 units daily. She declined the flu vaccine today. She stated right after surgery she has some vasomotor symptoms but not as bad now. Patient with no past history of any abnormal Pap smears.  Past medical history,surgical history, family history and social history were all reviewed and documented in the EPIC chart.  Gynecologic History Patient's last menstrual period was 02/17/2015. Contraception: status post hysterectomy Last Pap: 2015. Results were: normal Last mammogram: No previous study. Results were: No previous study  Obstetric History OB History  Gravida Para Term Preterm AB Living  0 0 0 0 0 0  SAB TAB Ectopic Multiple Live Births  0 0 0 0           ROS: A ROS was performed and pertinent positives and negatives are included in the history.  GENERAL: No fevers or chills. HEENT: No change in vision, no earache, sore throat or sinus congestion. NECK: No pain or stiffness. CARDIOVASCULAR: No chest pain or pressure. No palpitations. PULMONARY: No shortness of breath, cough or wheeze. GASTROINTESTINAL: No abdominal pain, nausea, vomiting or diarrhea, melena or bright red blood per rectum. GENITOURINARY: No urinary frequency, urgency, hesitancy or dysuria. MUSCULOSKELETAL: No joint or muscle pain, no back pain, no recent trauma. DERMATOLOGIC: No rash, no itching, no lesions. ENDOCRINE: No polyuria,  polydipsia, no heat or cold intolerance. No recent change in weight. HEMATOLOGICAL: No anemia or easy bruising or bleeding. NEUROLOGIC: No headache, seizures, numbness, tingling or weakness. PSYCHIATRIC: No depression, no loss of interest in normal activity or change in sleep pattern.     Exam: chaperone present  BP 122/86   Ht 5' 2.5" (1.588 m)   Wt 133 lb (60.3 kg)   LMP 02/17/2015   BMI 23.94 kg/m   Body mass index is 23.94 kg/m.  General appearance : Well developed well nourished female. No acute distress HEENT: Eyes: no retinal hemorrhage or exudates,  Neck supple, trachea midline, no carotid bruits, no thyroidmegaly Lungs: Clear to auscultation, no rhonchi or wheezes, or rib retractions  Heart: Regular rate and rhythm, no murmurs or gallops Breast:Examined in sitting and supine position were symmetrical in appearance, no palpable masses or tenderness,  no skin retraction, no nipple inversion, no nipple discharge, no skin discoloration, no axillary or supraclavicular lymphadenopathy Abdomen: no palpable masses or tenderness, no rebound or guarding Extremities: no edema or skin discoloration or tenderness  Pelvic:  Bartholin, Urethra, Skene Glands: Within normal limits             Vagina: No gross lesions or discharge  Cervix: Absent  Uterus  absent  Adnexa  Without masses or tenderness  Anus and perineum  normal   Rectovaginal  normal sphincter tone without palpated masses or tenderness             Hemoccult not indicated     Assessment/Plan:  40 y.o. female for annual exam will return back to the office in 1-2  weeks for pelvic ultrasound to assess her remaining right ovary since she's been complaining of on and off low abdominal pelvic discomfort. Also for vaginal discomfort during intercourse because of dryness have recommended Astroglyde to use when necessary. When she comes for her ultrasound she'll come in a fasting state for the following screening blood work: Fasting  lipid profile, comprehensive metabolic panel, TSH, CBC, and urinalysis. Also because of her history of vitamin D deficiency will check a vitamin D level today as well. Requisition to schedule her overdue mammogram was provided. Patient declined flu vaccine. Since she has one remaining ovary has had some hot flashes and FSH will be tested as well.   Terrance Mass MD, 9:36 AM 05/18/2016

## 2016-05-20 ENCOUNTER — Encounter: Payer: Self-pay | Admitting: Gynecology

## 2016-05-20 ENCOUNTER — Ambulatory Visit (INDEPENDENT_AMBULATORY_CARE_PROVIDER_SITE_OTHER): Payer: BLUE CROSS/BLUE SHIELD

## 2016-05-20 ENCOUNTER — Ambulatory Visit (INDEPENDENT_AMBULATORY_CARE_PROVIDER_SITE_OTHER): Payer: BLUE CROSS/BLUE SHIELD | Admitting: Gynecology

## 2016-05-20 ENCOUNTER — Other Ambulatory Visit: Payer: Self-pay | Admitting: Gynecology

## 2016-05-20 VITALS — BP 118/74

## 2016-05-20 DIAGNOSIS — R102 Pelvic and perineal pain: Secondary | ICD-10-CM

## 2016-05-20 DIAGNOSIS — R1031 Right lower quadrant pain: Secondary | ICD-10-CM

## 2016-05-20 DIAGNOSIS — Z01411 Encounter for gynecological examination (general) (routine) with abnormal findings: Secondary | ICD-10-CM | POA: Diagnosis not present

## 2016-05-20 DIAGNOSIS — N898 Other specified noninflammatory disorders of vagina: Secondary | ICD-10-CM | POA: Diagnosis not present

## 2016-05-20 DIAGNOSIS — E038 Other specified hypothyroidism: Secondary | ICD-10-CM | POA: Diagnosis not present

## 2016-05-20 DIAGNOSIS — Z8639 Personal history of other endocrine, nutritional and metabolic disease: Secondary | ICD-10-CM | POA: Diagnosis not present

## 2016-05-20 LAB — CBC WITH DIFFERENTIAL/PLATELET
BASOS ABS: 0 {cells}/uL (ref 0–200)
BASOS PCT: 0 %
EOS ABS: 225 {cells}/uL (ref 15–500)
Eosinophils Relative: 5 %
HCT: 41 % (ref 35.0–45.0)
HEMOGLOBIN: 13.6 g/dL (ref 11.7–15.5)
LYMPHS ABS: 1440 {cells}/uL (ref 850–3900)
Lymphocytes Relative: 32 %
MCH: 29.1 pg (ref 27.0–33.0)
MCHC: 33.2 g/dL (ref 32.0–36.0)
MCV: 87.8 fL (ref 80.0–100.0)
MPV: 8.5 fL (ref 7.5–12.5)
Monocytes Absolute: 270 cells/uL (ref 200–950)
Monocytes Relative: 6 %
NEUTROS ABS: 2565 {cells}/uL (ref 1500–7800)
Neutrophils Relative %: 57 %
Platelets: 379 10*3/uL (ref 140–400)
RBC: 4.67 MIL/uL (ref 3.80–5.10)
RDW: 13.6 % (ref 11.0–15.0)
WBC: 4.5 10*3/uL (ref 3.8–10.8)

## 2016-05-20 LAB — COMPREHENSIVE METABOLIC PANEL
ALBUMIN: 4.3 g/dL (ref 3.6–5.1)
ALK PHOS: 74 U/L (ref 33–115)
ALT: 21 U/L (ref 6–29)
AST: 22 U/L (ref 10–30)
BUN: 14 mg/dL (ref 7–25)
CHLORIDE: 104 mmol/L (ref 98–110)
CO2: 23 mmol/L (ref 20–31)
CREATININE: 0.52 mg/dL (ref 0.50–1.10)
Calcium: 9.5 mg/dL (ref 8.6–10.2)
Glucose, Bld: 78 mg/dL (ref 65–99)
POTASSIUM: 3.9 mmol/L (ref 3.5–5.3)
SODIUM: 140 mmol/L (ref 135–146)
TOTAL PROTEIN: 7.2 g/dL (ref 6.1–8.1)
Total Bilirubin: 0.8 mg/dL (ref 0.2–1.2)

## 2016-05-20 LAB — LIPID PANEL
Cholesterol: 179 mg/dL (ref <200)
HDL: 67 mg/dL (ref >50)
LDL CALC: 103 mg/dL — AB (ref <100)
Total CHOL/HDL Ratio: 2.7 Ratio (ref <5.0)
Triglycerides: 47 mg/dL (ref <150)
VLDL: 9 mg/dL (ref <30)

## 2016-05-20 LAB — TSH: TSH: 2.56 m[IU]/L

## 2016-05-20 NOTE — Progress Notes (Signed)
   Patient is a 40 year old was seen the office on December 4 her annual exam and described on and off right lower quadrant discomfort as well as at times some dyspareunia but attributed to vaginal dryness. In October 2016 in [place] she underwent a total abdominal hysterectomy with left salpingo-oophorectomy and right salpingectomy as a result of fibroid uterus and bilateral endometriomas. She is here for an ultrasound to assess her remaining ovary. She was in no acute distress today. See previous note for additional information and she had her fasting blood work done today.  Ultrasound: Absent uterus and left adnexa. Right ovarian follicle measured 7 x 8 mm was noted negative color flow Doppler. Arterial blood flow to the right ovary was noted. No than amount noted.  Assessment/plan: Patient with nonspecific on and off mild right lower quadrant discomfort may be at time of ovulation or pelvic adhesions no intervention required at this time. Patient was reassured will notify does any abnormality in her screening blood work done today.  Greater than 90% time was spent counseling quantity care of this patient time of consultation 10 minutes

## 2016-05-21 ENCOUNTER — Other Ambulatory Visit: Payer: Self-pay | Admitting: Anesthesiology

## 2016-05-21 DIAGNOSIS — N951 Menopausal and female climacteric states: Secondary | ICD-10-CM

## 2016-05-21 LAB — VITAMIN D 25 HYDROXY (VIT D DEFICIENCY, FRACTURES): VIT D 25 HYDROXY: 37 ng/mL (ref 30–100)

## 2016-05-21 LAB — FOLLICLE STIMULATING HORMONE: FSH: 140.9 m[IU]/mL — AB

## 2016-05-21 LAB — URINE CULTURE

## 2016-05-21 MED ORDER — NITROFURANTOIN MONOHYD MACRO 100 MG PO CAPS: 100.0000 mg | ORAL_CAPSULE | Freq: Two times a day (BID) | ORAL | 0 refills | Status: AC

## 2016-05-26 ENCOUNTER — Other Ambulatory Visit: Payer: Self-pay

## 2016-06-11 ENCOUNTER — Ambulatory Visit (INDEPENDENT_AMBULATORY_CARE_PROVIDER_SITE_OTHER): Payer: BLUE CROSS/BLUE SHIELD | Admitting: Physician Assistant

## 2016-06-11 VITALS — BP 113/74 | HR 69 | Temp 98.0°F | Resp 16 | Ht 64.0 in | Wt 134.0 lb

## 2016-06-11 DIAGNOSIS — J029 Acute pharyngitis, unspecified: Secondary | ICD-10-CM | POA: Diagnosis not present

## 2016-06-11 DIAGNOSIS — R05 Cough: Secondary | ICD-10-CM

## 2016-06-11 DIAGNOSIS — J45909 Unspecified asthma, uncomplicated: Secondary | ICD-10-CM

## 2016-06-11 DIAGNOSIS — R059 Cough, unspecified: Secondary | ICD-10-CM

## 2016-06-11 LAB — POCT RAPID STREP A (OFFICE): Rapid Strep A Screen: NEGATIVE

## 2016-06-11 MED ORDER — BENZONATATE 100 MG PO CAPS: 100.0000 mg | ORAL_CAPSULE | Freq: Three times a day (TID) | ORAL | 0 refills | Status: DC | PRN

## 2016-06-11 MED ORDER — ALBUTEROL SULFATE HFA 108 (90 BASE) MCG/ACT IN AERS: 2.0000 | INHALATION_SPRAY | Freq: Four times a day (QID) | RESPIRATORY_TRACT | 1 refills | Status: DC | PRN

## 2016-06-11 NOTE — Patient Instructions (Addendum)
-   We will treat this as a respiratory viral infection.  - I recommend you rest, drink plenty of fluids, eat light meals including soups.  - You may use OTC sudaphed for congestion and continue to take flonase daily.  - You may use Tessalon pearls during the day.  - You may also use Tylenol or ibuprofen over-the-counter for your sore throat.  - Please let me know if you are not seeing any improvement or get worse in 5-7 days.  Thank you for letting me participate in your health and well being.

## 2016-06-11 NOTE — Progress Notes (Signed)
MRN: [id number] DOB: [birth year]  Subjective:   Emily Ritter is a 40 y.o. female presenting for chief complaint of Fever (X last night) and Sore Throat .  Reports 2 day history of sinus congestion, rhinorrhea, left ear pain, sore throat, minimal dry cough (no hemoptysis), myalgia, subjective fever, and chills.  It all started with a sore throat. Has tried theraflu with mild relief. Denies wheezing, shortness of breath and chest tightness, fatigue, nausea, vomiting, abdominal pain and diarrhea. Has not had  sick contact with any. Has history of seasonal allergies and asthma. She is out of her albuterol inhaler.  Patient has had flu shot this season. No smoking.  Denies any other aggravating or relieving factors, no other questions or concerns.  Emily Ritter has a current medication list which includes the following prescription(s): albuterol, azelastine, fluticasone, levothyroxine, and multivitamin. Also is allergic to penicillins and amoxicillin.  Emily Ritter  has a past medical history of Anxiety; Anxiety and depression; Asthma; Endometriosis; Frequent headaches; Hypothyroidism; Melasma; and Vitamin D deficiency. Also  has a past surgical history that includes Laparoscopic ovarian cystectomy (2002); Diagnostic laparoscopy (1999); Thyroid surgery (2004); Abdominal hysterectomy (03/2015); Oophorectomy (Left, 03-2015); and TAH/LSO.   Objective:   Vitals: BP 113/74 (BP Location: Left Arm, Patient Position: Sitting, Cuff Size: Normal)   Pulse 69   Temp 98 F (36.7 C) (Oral)   Resp 16   Ht 5\' 4"  (1.626 m)   Wt 134 lb (60.8 kg)   LMP 02/17/2015   SpO2 98%   BMI 23.00 kg/m   Physical Exam  Constitutional: She is oriented to person, place, and time. She appears well-developed and well-nourished.  HENT:  Head: Normocephalic and atraumatic.  Right Ear: Tympanic membrane, external ear and ear canal normal.  Left Ear: Tympanic membrane, external ear and ear canal normal.  Nose: Mucosal edema ( more  prominent on right side) and rhinorrhea present. Right sinus exhibits no maxillary sinus tenderness and no frontal sinus tenderness. Left sinus exhibits no maxillary sinus tenderness and no frontal sinus tenderness.  Mouth/Throat: Uvula is midline. Posterior oropharyngeal erythema present. Tonsils are 2+ on the right. Tonsils are 2+ on the left. No tonsillar exudate.  Eyes: Conjunctivae are normal.  Neck: Normal range of motion.  Cardiovascular: Normal rate, regular rhythm and normal heart sounds.   Pulmonary/Chest: Effort normal and breath sounds normal.  Neurological: She is alert and oriented to person, place, and time.  Skin: Skin is warm and dry.  Psychiatric: She has a normal mood and affect.  Vitals reviewed.    Results for orders placed or performed in visit on 06/11/16 (from the past 24 hour(s))  POCT rapid strep A     Status: None   Collection Time: 06/11/16  9:56 AM  Result Value Ref Range   Rapid Strep A Screen Negative Negative    Assessment and Plan :  1. Sore throat -Likely viral etiology, will treat with symptomatic care. Pt to return to 5-7 days if no improvement in symptoms, or sooner if symptoms worsen.  - POCT rapid strep A - Culture, Group A Strep  2. Uncomplicated asthma, unspecified asthma severity, unspecified whether persistent - albuterol (PROVENTIL HFA;VENTOLIN HFA) 108 (90 Base) MCG/ACT inhaler; Inhale 2 puffs into the lungs every 6 (six) hours as needed for wheezing or shortness of breath.  Dispense: 1 Inhaler; Refill: 1  3. Cough - benzonatate (TESSALON) 100 MG capsule; Take 1-2 capsules (100-200 mg total) by mouth 3 (three) times daily as needed for  cough.  Dispense: 40 capsule; Refill: 0  Tenna Delaine, PA-C  [facility] 06/11/2016 10:06 AM

## 2016-06-14 LAB — CULTURE, GROUP A STREP

## 2016-06-16 ENCOUNTER — Telehealth: Payer: Self-pay | Admitting: Emergency Medicine

## 2016-06-16 NOTE — Telephone Encounter (Signed)
-----   Message from Leonie Douglas, PA-C sent at 06/16/2016  8:24 AM EST ----- I have tried to reach pt via phone but could not get a hold of her. Will you please call and let her know that her strep culture did grow out some bacteria, it was not strep A but there were bacteria present. These other bacteria typically clear without antibiotics however if she is not any better, please let me know as I am willing to prescribe an antibiotic at this time. Thanks!

## 2016-06-26 ENCOUNTER — Encounter: Payer: Self-pay | Admitting: *Deleted

## 2016-10-28 ENCOUNTER — Encounter: Payer: Self-pay | Admitting: Gynecology

## 2017-09-10 ENCOUNTER — Ambulatory Visit: Payer: BLUE CROSS/BLUE SHIELD | Admitting: Obstetrics & Gynecology

## 2017-09-10 ENCOUNTER — Encounter: Payer: Self-pay | Admitting: Obstetrics & Gynecology

## 2017-09-10 VITALS — BP 126/78

## 2017-09-10 DIAGNOSIS — Z90710 Acquired absence of both cervix and uterus: Secondary | ICD-10-CM | POA: Diagnosis not present

## 2017-09-10 DIAGNOSIS — Z01411 Encounter for gynecological examination (general) (routine) with abnormal findings: Secondary | ICD-10-CM

## 2017-09-10 DIAGNOSIS — N951 Menopausal and female climacteric states: Secondary | ICD-10-CM | POA: Diagnosis not present

## 2017-09-10 DIAGNOSIS — Z8742 Personal history of other diseases of the female genital tract: Secondary | ICD-10-CM

## 2017-09-10 DIAGNOSIS — E041 Nontoxic single thyroid nodule: Secondary | ICD-10-CM | POA: Diagnosis not present

## 2017-09-10 NOTE — Progress Notes (Signed)
Emily Ritter 1976/05/21 [id number]    History:    42 y.o. G0 Boyfriend x 7 yrs  RP:   Established patient presenting for annual gyn exam   HPI: TAH/Bilateral Salpingectomy/Left Oophorectomy 03/2015 in [place] because of Fibroids/Endometriosis.  No pelvic pain.  But occasional low back pain.  No pain with intercourse.  Complains of hot flushes and night sweats.  Per patient, an [place] done in [place] last year showed perimenopause.  Urine and bowel movements normal.  Breasts normal.  Thyroid nodule for which she had a biopsy, but the pathology showed only blood, no tissue.  Hypothyroidism on Synthroid.  Health labs with family physician, Dr. Larose Kells.  Past medical history,surgical history, family history and social history were all reviewed and documented in the EPIC chart.  Gynecologic History Patient's last menstrual period was 02/17/2015. Contraception: status post hysterectomy Last Pap: 03/2014. Results were: negative Last mammogram: 12/2016. Results were: normal per patient Bone Density: Never Colonoscopy: Never  Obstetric History OB History  Gravida Para Term Preterm AB Living  0 0 0 0 0 0  SAB TAB Ectopic Multiple Live Births  0 0 0 0     thy  ROS: A ROS was performed and pertinent positives and negatives are included in the history.  GENERAL: No fevers or chills. HEENT: No change in vision, no earache, sore throat or sinus congestion. NECK: No pain or stiffness. CARDIOVASCULAR: No chest pain or pressure. No palpitations. PULMONARY: No shortness of breath, cough or wheeze. GASTROINTESTINAL: No abdominal pain, nausea, vomiting or diarrhea, melena or bright red blood per rectum. GENITOURINARY: No urinary frequency, urgency, hesitancy or dysuria. MUSCULOSKELETAL: No joint or muscle pain, no back pain, no recent trauma. DERMATOLOGIC: No rash, no itching, no lesions. ENDOCRINE: No polyuria, polydipsia, no heat or cold intolerance. No recent change in weight. HEMATOLOGICAL: No anemia or  easy bruising or bleeding. NEUROLOGIC: No headache, seizures, numbness, tingling or weakness. PSYCHIATRIC: No depression, no loss of interest in normal activity or change in sleep pattern.     Exam:   BP 126/78   LMP 02/17/2015   There is no height or weight on file to calculate BMI.  General appearance : Well developed well nourished female. No acute distress HEENT: Eyes: no retinal hemorrhage or exudates,  Neck supple, trachea midline, no carotid bruits, no thyroidmegaly Lungs: Clear to auscultation, no rhonchi or wheezes, or rib retractions  Heart: Regular rate and rhythm, no murmurs or gallops Breast:Examined in sitting and supine position were symmetrical in appearance, no palpable masses or tenderness,  no skin retraction, no nipple inversion, no nipple discharge, no skin discoloration, no axillary or supraclavicular lymphadenopathy Abdomen: no palpable masses or tenderness, no rebound or guarding Extremities: no edema or skin discoloration or tenderness  Pelvic: Vulva: Normal             Vagina: No gross lesions or discharge.  Pap reflex done.  Cervix/Uterus absent  Adnexa  Without masses or tenderness  Anus: Normal   Assessment/Plan:  42 y.o. female for annual exam   1. Encounter for gynecological examination with abnormal finding Gynecologic exam status post total hysterectomy.  Pap reflex done.  Breast exam normal.  Health labs with family physician.    2. History of total hysterectomy  3. History of endometriosis Status post TAH bilateral salpingectomy and left oophorectomy.  As symptomatic and no pelvic mass on exam.  Will observe.  4. Hot flushes, perimenopausal Probably perimenopausal or entering menopause.  [place] today.  Patient will use Phyto-she will estrogen to control symptoms at this time.  Hormone replacement therapy with estradiol patch discussed with patient.  Usage, benefits and risks reviewed.  Call back if decides to start on it. - [place]  5. Thyroid  nodule Thyroid nodule.  Biopsy done in [place], but only blood, no tissue per pathology report.  Referred to endocrinology for further investigation and management.  Other orders - levothyroxine (SYNTHROID, LEVOTHROID) 75 MCG tablet; Take 75 mcg by mouth daily before breakfast.  Counseling on above issues and coordination of care more than 50% for 10 minutes.  Princess Bruins MD, 3:23 PM 09/10/2017

## 2017-09-10 NOTE — Patient Instructions (Signed)
1. Encounter for gynecological examination with abnormal finding Gynecologic exam status post total hysterectomy.  Pap reflex done.  Breast exam normal.  Health labs with family physician.    2. History of total hysterectomy  3. History of endometriosis Status post TAH bilateral salpingectomy and left oophorectomy.  As symptomatic and no pelvic mass on exam.  Will observe.  4. Hot flushes, perimenopausal Probably perimenopausal or entering menopause.  [place] today.  Patient will use Phyto-she will estrogen to control symptoms at this time.  Hormone replacement therapy with estradiol patch discussed with patient.  Usage, benefits and risks reviewed.  Call back if decides to start on it. - [place]  5. Thyroid nodule Thyroid nodule.  Biopsy done in [place], but only blood, no tissue per pathology report.  Referred to endocrinology for further investigation and management.  Other orders - levothyroxine (SYNTHROID, LEVOTHROID) 75 MCG tablet; Take 75 mcg by mouth daily before breakfast.  Azucena Kuba, fue un placer conocerle hoy!  Voy a informarle de sus [employer].  Health Maintenance, Female Adopting a healthy lifestyle and getting preventive care can go a long way to promote health and wellness. Talk with your health care provider about what schedule of regular examinations is right for you. This is a good chance for you to check in with your provider about disease prevention and staying healthy. In between checkups, there are plenty of things you can do on your own. Experts have done a lot of research about which lifestyle changes and preventive measures are most likely to keep you healthy. Ask your health care provider for more information. Weight and diet Eat a healthy diet  Be sure to include plenty of vegetables, fruits, low-fat dairy products, and lean protein.  Do not eat a lot of foods high in solid fats, added sugars, or salt.  Get regular exercise. This is one of the most important  things you can do for your health. ? Most adults should exercise for at least 150 minutes each week. The exercise should increase your heart rate and make you sweat (moderate-intensity exercise). ? Most adults should also do strengthening exercises at least twice a week. This is in addition to the moderate-intensity exercise.  Maintain a healthy weight  Body mass index (BMI) is a measurement that can be used to identify possible weight problems. It estimates body fat based on height and weight. Your health care provider can help determine your BMI and help you achieve or maintain a healthy weight.  For females 5 years of age and older: ? A BMI below 18.5 is considered underweight. ? A BMI of 18.5 to 24.9 is normal. ? A BMI of 25 to 29.9 is considered overweight. ? A BMI of 30 and above is considered obese.  Watch levels of cholesterol and blood lipids  You should start having your blood tested for lipids and cholesterol at 43 years of age, then have this test every 5 years.  You may need to have your cholesterol levels checked more often if: ? Your lipid or cholesterol levels are high. ? You are older than 43 years of age. ? You are at high risk for heart disease.  Cancer screening Lung Cancer  Lung cancer screening is recommended for adults 76-36 years old who are at high risk for lung cancer because of a history of smoking.  A yearly low-dose CT scan of the lungs is recommended for people who: ? Currently smoke. ? Have quit within the past 15 years. ?  Have at least a 30-pack-year history of smoking. A pack year is smoking an average of one pack of cigarettes a day for 1 year.  Yearly screening should continue until it has been 15 years since you quit.  Yearly screening should stop if you develop a health problem that would prevent you from having lung cancer treatment.  Breast Cancer  Practice breast self-awareness. This means understanding how your breasts normally appear  and feel.  It also means doing regular breast self-exams. Let your health care provider know about any changes, no matter how small.  If you are in your 20s or 30s, you should have a clinical breast exam (CBE) by a health care provider every 1-3 years as part of a regular health exam.  If you are 90 or older, have a CBE every year. Also consider having a breast X-ray (mammogram) every year.  If you have a family history of breast cancer, talk to your health care provider about genetic screening.  If you are at high risk for breast cancer, talk to your health care provider about having an MRI and a mammogram every year.  Breast cancer gene (BRCA) assessment is recommended for women who have family members with BRCA-related cancers. BRCA-related cancers include: ? Breast. ? Ovarian. ? Tubal. ? Peritoneal cancers.  Results of the assessment will determine the need for genetic counseling and BRCA1 and BRCA2 testing.  Cervical Cancer Your health care provider may recommend that you be screened regularly for cancer of the pelvic organs (ovaries, uterus, and vagina). This screening involves a pelvic examination, including checking for microscopic changes to the surface of your cervix (Pap test). You may be encouraged to have this screening done every 3 years, beginning at age 48.  For women ages 56-65, health care providers may recommend pelvic exams and Pap testing every 3 years, or they may recommend the Pap and pelvic exam, combined with testing for human papilloma virus (HPV), every 5 years. Some types of HPV increase your risk of cervical cancer. Testing for HPV may also be done on women of any age with unclear Pap test results.  Other health care providers may not recommend any screening for nonpregnant women who are considered low risk for pelvic cancer and who do not have symptoms. Ask your health care provider if a screening pelvic exam is right for you.  If you have had past treatment  for cervical cancer or a condition that could lead to cancer, you need Pap tests and screening for cancer for at least 20 years after your treatment. If Pap tests have been discontinued, your risk factors (such as having a new sexual partner) need to be reassessed to determine if screening should resume. Some women have medical problems that increase the chance of getting cervical cancer. In these cases, your health care provider may recommend more frequent screening and Pap tests.  Colorectal Cancer  This type of cancer can be detected and often prevented.  Routine colorectal cancer screening usually begins at 43 years of age and continues through 43 years of age.  Your health care provider may recommend screening at an earlier age if you have risk factors for colon cancer.  Your health care provider may also recommend using home test kits to check for hidden blood in the stool.  A small camera at the end of a tube can be used to examine your colon directly (sigmoidoscopy or colonoscopy). This is done to check for the earliest forms of colorectal  cancer.  Routine screening usually begins at age 17.  Direct examination of the colon should be repeated every 5-10 years through 43 years of age. However, you may need to be screened more often if early forms of precancerous polyps or small growths are found.  Skin Cancer  Check your skin from head to toe regularly.  Tell your health care provider about any new moles or changes in moles, especially if there is a change in a mole's shape or color.  Also tell your health care provider if you have a mole that is larger than the size of a pencil eraser.  Always use sunscreen. Apply sunscreen liberally and repeatedly throughout the day.  Protect yourself by wearing long sleeves, pants, a wide-brimmed hat, and sunglasses whenever you are outside.  Heart disease, diabetes, and high blood pressure  High blood pressure causes heart disease and  increases the risk of stroke. High blood pressure is more likely to develop in: ? People who have blood pressure in the high end of the normal range (130-139/85-89 mm Hg). ? People who are overweight or obese. ? People who are African American.  If you are 34-2 years of age, have your blood pressure checked every 3-5 years. If you are 61 years of age or older, have your blood pressure checked every year. You should have your blood pressure measured twice-once when you are at a hospital or clinic, and once when you are not at a hospital or clinic. Record the average of the two measurements. To check your blood pressure when you are not at a hospital or clinic, you can use: ? An automated blood pressure machine at a pharmacy. ? A home blood pressure monitor.  If you are between 88 years and 64 years old, ask your health care provider if you should take aspirin to prevent strokes.  Have regular diabetes screenings. This involves taking a blood sample to check your fasting blood sugar level. ? If you are at a normal weight and have a low risk for diabetes, have this test once every three years after 43 years of age. ? If you are overweight and have a high risk for diabetes, consider being tested at a younger age or more often. Preventing infection Hepatitis B  If you have a higher risk for hepatitis B, you should be screened for this virus. You are considered at high risk for hepatitis B if: ? You were born in a country where hepatitis B is common. Ask your health care provider which countries are considered high risk. ? Your parents were born in a high-risk country, and you have not been immunized against hepatitis B (hepatitis B vaccine). ? You have HIV or AIDS. ? You use needles to inject street drugs. ? You live with someone who has hepatitis B. ? You have had sex with someone who has hepatitis B. ? You get hemodialysis treatment. ? You take certain medicines for conditions, including  cancer, organ transplantation, and autoimmune conditions.  Hepatitis C  Blood testing is recommended for: ? Everyone born from [birth year] through 1966. ? Anyone with known risk factors for hepatitis C.  Sexually transmitted infections (STIs)  You should be screened for sexually transmitted infections (STIs) including gonorrhea and chlamydia if: ? You are sexually active and are younger than 43 years of age. ? You are older than 43 years of age and your health care provider tells you that you are at risk for this type of infection. ? Your  sexual activity has changed since you were last screened and you are at an increased risk for chlamydia or gonorrhea. Ask your health care provider if you are at risk.  If you do not have HIV, but are at risk, it may be recommended that you take a prescription medicine daily to prevent HIV infection. This is called pre-exposure prophylaxis (PrEP). You are considered at risk if: ? You are sexually active and do not regularly use condoms or know the HIV status of your partner(s). ? You take drugs by injection. ? You are sexually active with a partner who has HIV.  Talk with your health care provider about whether you are at high risk of being infected with HIV. If you choose to begin PrEP, you should first be tested for HIV. You should then be tested every 3 months for as long as you are taking PrEP. Pregnancy  If you are premenopausal and you may become pregnant, ask your health care provider about preconception counseling.  If you may become pregnant, take 400 to 800 micrograms (mcg) of folic acid every day.  If you want to prevent pregnancy, talk to your health care provider about birth control (contraception). Osteoporosis and menopause  Osteoporosis is a disease in which the bones lose minerals and strength with aging. This can result in serious bone fractures. Your risk for osteoporosis can be identified using a bone density scan.  If you are 31 years  of age or older, or if you are at risk for osteoporosis and fractures, ask your health care provider if you should be screened.  Ask your health care provider whether you should take a calcium or vitamin D supplement to lower your risk for osteoporosis.  Menopause may have certain physical symptoms and risks.  Hormone replacement therapy may reduce some of these symptoms and risks. Talk to your health care provider about whether hormone replacement therapy is right for you. Follow these instructions at home:  Schedule regular health, dental, and eye exams.  Stay current with your immunizations.  Do not use any tobacco products including cigarettes, chewing tobacco, or electronic cigarettes.  If you are pregnant, do not drink alcohol.  If you are breastfeeding, limit how much and how often you drink alcohol.  Limit alcohol intake to no more than 1 drink per day for nonpregnant women. One drink equals 12 ounces of beer, 5 ounces of wine, or 1 ounces of hard liquor.  Do not use street drugs.  Do not share needles.  Ask your health care provider for help if you need support or information about quitting drugs.  Tell your health care provider if you often feel depressed.  Tell your health care provider if you have ever been abused or do not feel safe at home. This information is not intended to replace advice given to you by your health care provider. Make sure you discuss any questions you have with your health care provider. Document Released: 12/15/2010 Document Revised: 11/07/2015 Document Reviewed: 03/05/2015 Elsevier Interactive Patient Education  [employer].

## 2017-09-11 LAB — FOLLICLE STIMULATING HORMONE: FSH: 144.1 m[IU]/mL — ABNORMAL HIGH

## 2017-09-13 ENCOUNTER — Telehealth: Payer: Self-pay | Admitting: *Deleted

## 2017-09-13 DIAGNOSIS — E038 Other specified hypothyroidism: Secondary | ICD-10-CM

## 2017-09-13 DIAGNOSIS — E041 Nontoxic single thyroid nodule: Secondary | ICD-10-CM

## 2017-09-13 LAB — PAP IG W/ RFLX HPV ASCU

## 2017-09-13 NOTE — Telephone Encounter (Signed)
Referral placed at [facility], they will contact pt to schedule

## 2017-09-13 NOTE — Telephone Encounter (Signed)
-----   Message from Princess Bruins, MD sent at 09/10/2017  3:36 PM EDT ----- Regarding: Refer to Endocrinologist Thyroid nodule.  Hypothyroidism.

## 2017-09-14 ENCOUNTER — Encounter: Payer: BLUE CROSS/BLUE SHIELD | Admitting: Obstetrics & Gynecology

## 2017-09-29 NOTE — Telephone Encounter (Signed)
I called to follow up with endo office and they have received the approval they will be calling patient to schedule.

## 2017-10-28 NOTE — Telephone Encounter (Signed)
Emily Ritter has left message x 2 for pt to call. I am going to close encounter as pt is aware of referral

## 2017-11-16 ENCOUNTER — Encounter: Payer: Self-pay | Admitting: Endocrinology

## 2018-02-22 ENCOUNTER — Encounter: Payer: Self-pay | Admitting: Women's Health

## 2018-02-22 ENCOUNTER — Ambulatory Visit: Payer: BLUE CROSS/BLUE SHIELD | Admitting: Women's Health

## 2018-02-22 VITALS — BP 110/78

## 2018-02-22 DIAGNOSIS — N898 Other specified noninflammatory disorders of vagina: Secondary | ICD-10-CM | POA: Diagnosis not present

## 2018-02-22 DIAGNOSIS — R300 Dysuria: Secondary | ICD-10-CM | POA: Diagnosis not present

## 2018-02-22 DIAGNOSIS — B3731 Acute candidiasis of vulva and vagina: Secondary | ICD-10-CM

## 2018-02-22 DIAGNOSIS — B373 Candidiasis of vulva and vagina: Secondary | ICD-10-CM | POA: Diagnosis not present

## 2018-02-22 LAB — WET PREP FOR TRICH, YEAST, CLUE

## 2018-02-22 MED ORDER — FLUCONAZOLE 150 MG PO TABS: 150.0000 mg | ORAL_TABLET | Freq: Once | ORAL | 1 refills | Status: AC

## 2018-02-22 NOTE — Patient Instructions (Signed)
Candidiasis vaginal en los adultos °(Gastrointestinal Yeast Infection, Adult) °La candidiasis vaginal es una afección que causa dolor, hinchazón y enrojecimiento (inflamación) de la vagina. También causa secreción vaginal. Esta es una enfermedad frecuente. Algunas mujeres contraen esta infección con frecuencia. °CAUSAS °La causa de la infección es un cambio en el equilibrio normal de los hongos (cándida) y las bacterias que viven en la vagina. Esta alteración deriva en el crecimiento excesivo de los hongos, lo que causa la inflamación. °FACTORES DE RIESGO °Es más probable que esta afección se manifieste en: °· Las mujeres que toman antibióticos. °· Las mujeres que tienen diabetes. °· Las mujeres que toman anticonceptivos. °· Las mujeres que están embarazadas. °· Las mujeres que se hacen duchas vaginales con frecuencia. °· Las mujeres que tienen un sistema de defensa (inmunitario) débil. °· Las mujeres que han tomado corticoides durante mucho tiempo. °· Las mujeres que usan ropa ajustada con frecuencia. °SÍNTOMAS °Los síntomas de esta afección incluyen lo siguiente: °· Secreción vaginal blanca y espesa. °· Hinchazón, picazón, enrojecimiento e irritación de la vagina. Los labios de la vagina (vulva) también se pueden infectar. °· Dolor o ardor al orinar. °· Dolor durante las relaciones sexuales. °DIAGNÓSTICO °Esta afección se diagnostica mediante la historia clínica y un examen físico. Este incluye un examen pélvico. El médico examinará una muestra de la secreción vaginal con un microscopio. Probablemente el médico envíe esta muestra al laboratorio para analizarla y confirmar el diagnóstico. °TRATAMIENTO °Esta afección se trata con medicamentos. Los medicamentos pueden ser recetados o de venta libre. Podrán indicarle que use uno o más de lo siguiente: °· Medicamentos por vía oral. °· Medicamentos que se aplican como una crema. °· Medicamentos que se colocan directamente en la vagina (óvulos vaginales). °INSTRUCCIONES  PARA EL CUIDADO EN EL HOGAR °· Tome o aplíquese los medicamentos de venta libre y recetados solamente como se lo haya indicado el médico. °· No tenga relaciones sexuales hasta que el médico lo autorice. Comunique a su compañero sexual que tiene una infección por hongos. Esas personas deben consultar al médico si tienen síntomas. °· No use ropa ajustada, como pantis o pantalones ajustados. °· Evite el uso de tampones hasta que el médico lo autorice. °· Consuma más yogur. Esto puede ayudar a evitar la recurrencia de la candidiasis. °· Intente darse un baño de asiento para aliviar las molestias. Se trata de un baño de agua tibia que se toma mientras se está sentado. El agua solo debe llegar hasta las caderas y cubrir las nalgas. Hágalo 3 o 4 veces al día o como se lo haya indicado el médico. °· No se haga duchas vaginales. °· Use ropa interior transpirable de algodón. °· Si tiene diabetes, mantenga bajo control los niveles de azúcar en la sangre. °SOLICITE ATENCIÓN MÉDICA SI: °· Tiene fiebre. °· Los síntomas desaparecen y luego reaparecen. °· Los síntomas no mejoran con el tratamiento. °· Los síntomas empeoran. °· Aparecen nuevos síntomas. °· Aparecen ampollas alrededor o adentro de la vagina. °· Le sale sangre de la vagina y no está menstruando. °· Siente dolor en el abdomen. °Esta información no tiene como fin reemplazar el consejo del médico. Asegúrese de hacerle al médico cualquier pregunta que tenga. °Document Released: 03/11/2005 Document Revised: 09/23/2015 Document Reviewed: 12/03/2014 °Elsevier Interactive Patient Education © 2018 Elsevier Inc. ° °

## 2018-02-23 NOTE — Progress Notes (Signed)
42 year old M HF G0 presents with complaint of vaginal itching with irritation that started 2 weeks ago and has gotten progressively worse.  Interpreter present.  TAH with [facility] for fibroids and endometriosis.  Mild burning at initiation of urination.  Denies recent antibiotic use, pain at end of stream of urination, change in frequency, abdominal pain or fever.  Medical problems include hypothyroidism primary care manages.  Exam: Appears well.  Abdomen soft nontender, external genitalia erythematous at introitus, speculum exam scant white discharge with mild erythema, wet prep positive for yeast. UA negative nitrites, leukocytes, blood, 6-10 squamous epithelials, moderate bacteria, 0-5 WBCs,  Yeast vaginitis  Plan: Diflucan 150 p.o. x1 dose with 1 refill if needed.  Yeast prevention discussed.  Instructed to call if no relief of symptoms.

## 2018-02-24 LAB — URINE CULTURE
MICRO NUMBER: 91087348
SPECIMEN QUALITY: ADEQUATE

## 2018-02-24 LAB — URINALYSIS, COMPLETE W/RFL CULTURE
BILIRUBIN URINE: NEGATIVE
Glucose, UA: NEGATIVE
HGB URINE DIPSTICK: NEGATIVE
HYALINE CAST: NONE SEEN /LPF
KETONES UR: NEGATIVE
Leukocyte Esterase: NEGATIVE
Nitrites, Initial: NEGATIVE
PROTEIN: NEGATIVE
RBC / HPF: NONE SEEN /HPF (ref 0–2)
Specific Gravity, Urine: 1.025 (ref 1.001–1.03)
pH: 6 (ref 5.0–8.0)

## 2018-02-24 LAB — CULTURE INDICATED

## 2018-05-10 ENCOUNTER — Other Ambulatory Visit: Payer: Self-pay | Admitting: *Deleted

## 2018-05-10 DIAGNOSIS — E041 Nontoxic single thyroid nodule: Secondary | ICD-10-CM

## 2018-05-10 DIAGNOSIS — E038 Other specified hypothyroidism: Secondary | ICD-10-CM

## 2018-05-10 MED ORDER — LEVOTHYROXINE SODIUM 75 MCG PO TABS: 75.0000 ug | ORAL_TABLET | Freq: Every day | ORAL | 1 refills | Status: DC

## 2018-05-10 NOTE — Telephone Encounter (Signed)
Needs to schedule with Endocrino.  Prescribe Synthroid to get to appointment date.

## 2018-05-10 NOTE — Telephone Encounter (Signed)
Patient take synthroid 75 mcg tablet, was referred to endo to management of thyroid nodule and medication however never scheduled with office. Patient is now calling requesting refill on synthroid 75 mcg tablet, are you willing to refill until patient can be referred back to endo? Please advise

## 2018-05-10 NOTE — Telephone Encounter (Signed)
Patient informed. 

## 2018-05-10 NOTE — Telephone Encounter (Signed)
Will forward to [facility] to relay Rx will be sent this once time only, patient needs to follow up with endo, for medication and thyroid nodule. I have placed the referral they will call to schedule.

## 2018-05-27 NOTE — Telephone Encounter (Signed)
Emily Ritter called from [facility] stating they have called patient several times and no return call. I asked Rosemarie Ax to call to see if she still wants referral, otherwise referral will be closed.

## 2018-06-23 NOTE — Telephone Encounter (Signed)
Patient was seen yesterday for OV and asked Claudia about referral. Per [facility] they have called several times and no response. I spoke with Loma Sousa and I asked if she could schedule with me and I was told she will need to send referral back to MD for approval, I explained no change same diagnosis. Loma Sousa said she will personally call patient once approved. I was not able to schedule, [facility] has to schedule.

## 2018-06-23 NOTE — Telephone Encounter (Signed)
Appointment on 07/06/18 @ 8:15am with Dr. Renne Crigler

## 2018-07-06 ENCOUNTER — Encounter (INDEPENDENT_AMBULATORY_CARE_PROVIDER_SITE_OTHER): Payer: Self-pay

## 2018-07-06 ENCOUNTER — Encounter: Payer: Self-pay | Admitting: Internal Medicine

## 2018-07-06 ENCOUNTER — Ambulatory Visit (INDEPENDENT_AMBULATORY_CARE_PROVIDER_SITE_OTHER): Payer: BLUE CROSS/BLUE SHIELD | Admitting: Internal Medicine

## 2018-07-06 VITALS — BP 108/70 | HR 72 | Ht 64.0 in | Wt 129.0 lb

## 2018-07-06 DIAGNOSIS — E039 Hypothyroidism, unspecified: Secondary | ICD-10-CM

## 2018-07-06 DIAGNOSIS — E042 Nontoxic multinodular goiter: Secondary | ICD-10-CM | POA: Diagnosis not present

## 2018-07-06 LAB — T4, FREE: Free T4: 1.18 ng/dL (ref 0.60–1.60)

## 2018-07-06 LAB — TSH: TSH: 0.97 u[IU]/mL (ref 0.35–4.50)

## 2018-07-06 MED ORDER — LEVOTHYROXINE SODIUM 75 MCG PO TABS: 75.0000 ug | ORAL_TABLET | Freq: Every day | ORAL | 3 refills | Status: DC

## 2018-07-06 NOTE — Patient Instructions (Signed)
We will schedule a new thyroid U/S.  Please stop at the lab.  Please continue Levothyroxine 75 mcg daily.  Take the thyroid hormone every day, with water, at least 30 minutes before breakfast, separated by at least 4 hours from: - acid reflux medications - calcium - iron - multivitamins  Please come back for a follow-up appointment in 1 year.

## 2018-07-06 NOTE — Progress Notes (Addendum)
Patient ID: Emily Ritter, female   DOB: 1975/10/29, 43 y.o.   MRN: [id number]    HPI  Emily Ritter is a 43 y.o.-year-old female, referred by her PCP, Dr. Larose Kells, for management of thyroid nodule.  She also has hypothyroidism.  She was diagnosed with a R thyroid nodule in 2003 >> had Right lobectomy and isthmusectomy then >> benign pathology. She developed postsurgical hypothyroidism.  She had a thyroid ultrasound and then an FNA.  Pathology reportedly only showed blood (this was done in [place]).  At this visit, she brings the report of her thyroid ultrasound (11/08/2017): Only left lobe visualized, measuring 5.8 x 1.9 x 1.9 cm, with multiple nodules: 1. Superior, spongiform, oval, thyroid nodule measuring 1.2 x 0.4 x 1.0 cm, with well-defined borders, low risk for malignancy 2. Superior, spongiform, well-circumscribed 0.6 x 0.4 x 0.5 cm, with well-defined borders, low risk for malignancy 3. Posterior-superior, hypoechoic, round, with interior echogenic linear opacity, measuring 0.6 x 0.5 x 0.5 centimeters, intermediate risk for malignancy 4. Spongiform, 0.7 x 0.2 x 0.4 cm nodule, low risk for malignancy 5. Inferior, hypoechoic, oval, thyroid nodule measuring 1.3 x 0.6 x 1.0 cm, with well-defined margins, low risk of malignancy 6. Inferior anechoic nodule, with cystic appearance, measuring 2.1 x 1.2 x 2.1 cm, with benign appearance 7. Several enlarged lymph node in cervical region 3 with preserved fatty hila and also 1 enlarged lymph node in cervical region to a, measuring 2.2 cm.  There is also has a fatty hilum.  There is another lymph node in cervical region 3 that appears hypoechoic, homogeneous, without a fatty hilum identified, measuring 1.2 cm in the largest dimension. We will scan this report.  Pt denies: - feeling nodules in neck - hoarseness - choking - SOB with lying down  But does have: - dysphagia, however, which could be related to her chronic tonsillitis  Pt. has been dx  with hypothyroidism in 2005 after her thyroid Sx >> on Levothyroxine 75 mcg.  She takes the thyroid hormone: - fasting - with water - separated by >30 min from b'fast  - no calcium, iron, PPIs - + multivitamins in am (4h later)  I reviewed pt's thyroid tests: 11/06/2017: TSH 1.29 Lab Results  Component Value Date   TSH 2.56 05/20/2016   TSH 2.22 07/29/2015   TSH 0.84 11/07/2014   TSH 2.73 03/20/2014   TSH 1.96 02/20/2014   TSH 0.473 02/20/2011   TSH 1.020 01/15/2011   Antithyroid antibodies: No results found for: THGAB No components found for: TPOAB  Pt denies: - weight gain - fatigue - cold intolerance - depression - diarrhea, + constipation - dry skin  Has: - hair loss  She has + FH of thyroid disorders in: sister - L nodule. No FH of thyroid cancer.  No h/o radiation tx to head or neck. No recent use of iodine supplements.  Pt. also has a history of hysterectomy  - 2016.  ROS: Constitutional: no weight gain/loss, no fatigue, no subjective hyperthermia/hypothermia Eyes: no blurry vision, no xerophthalmia ENT: no sore throat, + see HPI Cardiovascular: no CP/SOB/palpitations/leg swelling Respiratory: no cough/SOB Gastrointestinal: no N/V/D/C Musculoskeletal: no muscle/joint aches Skin: no rashes Neurological: no tremors/numbness/tingling/dizziness Psychiatric: no depression/+ anxiety-improved with exercise  Past Medical History:  Diagnosis Date  . Anxiety   . Anxiety and depression   . Asthma   . Endometriosis   . Frequent headaches   . Hypothyroidism   . Melasma   . Vitamin D deficiency  Past Surgical History:  Procedure Laterality Date  . ABDOMINAL HYSTERECTOMY  03/2015   done in [place], [place] has R ovary  . DIAGNOSTIC LAPAROSCOPY  1999   endometriosis  . LAPAROSCOPIC OVARIAN CYSTECTOMY  2002  . OOPHORECTOMY Left 03-2015  . TAH/LSO    . THYROID SURGERY  2004   lump removed   Social History   Socioeconomic History  . Marital status:  Single    Spouse name: Not on file  . Number of children: 0  . Years of education: Not on file  . Highest education level: Not on file  Occupational History  . 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 Financial resource strain: Not on file  . Food insecurity:    Worry: Not on file    Inability: Not on file  . Transportation needs:    Medical: Not on file    Non-medical: Not on file  Tobacco Use  . Smoking status: Never Smoker  . Smokeless tobacco: Never Used  Substance and Sexual Activity  . Alcohol use: No    Alcohol/week: 0.0 standard drinks  . Drug use: No  . Sexual activity: Yes    Partners: Male    Birth control/protection: None  Lifestyle  . Physical activity:    Days per week: Not on file    Minutes per session: Not on file  . Stress: Not on file  Relationships  . Social connections:    Talks on phone: Not on file    Gets together: Not on file    Attends religious service: Not on file    Active member of club or organization: Not on file    Attends meetings of clubs or organizations: Not on file    Relationship status: Not on file  . Intimate partner violence:    Fear of current or ex partner: Not on file    Emotionally abused: Not on file    Physically abused: Not on file    Forced sexual activity: Not on file  Other Topics Concern  . Not on file  Social History Narrative   Original from [place], moved to [place] ~ 2005   Divorced, lives w/ a room mate    Current Outpatient Medications on File Prior to Visit  Medication Sig Dispense Refill  . levothyroxine (SYNTHROID, LEVOTHROID) 75 MCG tablet Take 1 tablet (75 mcg total) by mouth daily before breakfast. 30 tablet 1  . Multiple Vitamin (MULTIVITAMIN) tablet Take 1 tablet by mouth daily.     No current facility-administered medications on file prior to visit.    Allergies  Allergen Reactions  . Penicillins Other (See Comments)    Unknown  . Amoxicillin Rash   Family History   Problem Relation Age of Onset  . Hypertension Mother   . Hyperlipidemia Mother   . Hypertension Father   . Heart disease Father        onset age 40, stent , CABG  . Hyperlipidemia Father   . Uterine cancer Paternal Aunt   . Colon cancer Neg Hx   . Breast cancer Neg Hx     PE: BP 108/70   Pulse 72   Ht 5\' 4"  (1.626 m)   Wt 129 lb (58.5 kg)   LMP 02/17/2015   SpO2 98%   BMI 22.14 kg/m  Wt Readings from Last 3 Encounters:  07/06/18 129 lb (58.5 kg)  06/11/16 134 lb (60.8 kg)  05/18/16 133 lb (60.3 kg)  Constitutional: Normal weight, in NAD Eyes: PERRLA, EOMI, no exophthalmos ENT: moist mucous membranes, no thyromegaly, but left thyroid lobe easily palpable, no cervical lymphadenopathy Cardiovascular: RRR, No MRG Respiratory: CTA B Gastrointestinal: abdomen soft, NT, ND, BS+ Musculoskeletal: no deformities, strength intact in all 4 Skin: moist, warm, no rashes Neurological: no tremor with outstretched hands, DTR normal in all 4  ASSESSMENT: 1.  Thyroid nodules  2. Hypothyroidism  PLAN:  1.  Thyroid nodules -I reviewed the report of patient's CT and ultrasound of the thyroid from 11/08/2017 from [place] along with the patient and the interpreter.  I also reviewed the images of her CT, however, although she also brings a CD with the ultrasound, I could not review the images. -However, with the help of the interpreter, I was able to review the report: She has several thyroid nodules (6) in the left thyroid lobe, which appears enlarged.  These nodules do not appear worrisome and the majority of them are smaller than 1 cm.  The largest nodule measures 2.1 cm and has a benign hallow.  This is anechoic, pointing towards a cystic structure.  It has, therefore, a low risk of malignancy.  The second larger nodule measures 1.3 cm and it is hyperechoic, well-defined, also with low risk of malignancy.  There is a third, 1.2 cm thyroid nodule isoechoic, well-defined, again, with low risk  of malignancy.  It is unclear which nodule was biopsied in the past, but I suspect that it was the largest nodule.  Patient tells me that the results were consistent with just blood.  I do not have these results. -We decided to obtain another ultrasound now so I can also review the images.  I will then see her back in a year. -I reassured her that if the nodules appear stable, we do not need another biopsy. -She has no neck compression symptoms other than occasional dysphagia which could be related to her chronic tonsillitis. -In this case, no intervention is needed for now  2. Patient with long-standing hypothyroidism, on levothyroxine therapy. - She takes 75 mcg levothyroxine daily - she appears euthyroid and has no complaints other than constipation and hair loss, which is longstanding - We discussed about correct intake of levothyroxine, fasting, with water, separated by at least 30 minutes from breakfast, and separated by more than 4 hours from calcium, iron, multivitamins, acid reflux medications (PPIs).  She takes it correctly - will check thyroid tests today: TSH, free T4 - If labs today are abnormal, she will need to return in ~6 weeks for repeat labs - Otherwise, I will see her back in 1 year  Needs refills.  Office Visit on 07/06/2018  Component Date Value Ref Range Status  . TSH 07/06/2018 0.97  0.35 - 4.50 uIU/mL Final  . Free T4 07/06/2018 1.18  0.60 - 1.60 ng/dL Final   Comment: Specimens from patients who are undergoing biotin therapy and /or ingesting biotin supplements may contain high levels of biotin.  The higher biotin concentration in these specimens interferes with this Free T4 assay.  Specimens that contain high levels  of biotin may cause false high results for this Free T4 assay.  Please interpret results in light of the total clinical presentation of the patient.     TFTs normal. Continue current LT4 dose.  Thyroid ultrasound (07/15/2018):  CLINICAL DATA:   43 year old female with thyroid nodules  EXAM: THYROID ULTRASOUND  TECHNIQUE: Ultrasound examination of the thyroid gland and adjacent soft tissues was performed.  COMPARISON:  None.  FINDINGS: Parenchymal Echotexture: Mildly heterogenous Isthmus: Surgically absent Right lobe: Surgically absent Left lobe: 5.4 cm x 1.5 cm x 1.8 cm ____________________________________________________  Nodule # 1: Location: Left; Superior Maximum size: 1.1 cm; Other 2 dimensions: 0.9 cm x 0.5 cm Composition: mixed cystic and solid (1) Echogenicity: isoechoic (1) ACR TI-RADS recommendations: Nodule does not meet criteria for surveillance or biopsy _____________________________________________________  Nodule # 2: Location: Left; Mid Maximum size: 1.45 cm; Other 2 dimensions: 1.3 cm x 0.7 cm Composition: solid/almost completely solid (2) Echogenicity: isoechoic (1) ACR TI-RADS recommendations: Nodule does not meet criteria for surveillance or biopsy _________________________________________________________  Nodule # 3: Location: Left; Inferior Maximum size: 2.2 cm; Other 2 dimensions: 1.9 cm x 1.3 cm ACR TI-RADS recommendations: Cystic nodule does not meet criteria for surveillance or biopsy _________________________________________________________  No adenopathy.  Unremarkable appearance of the surgical thyroid bed on the right  IMPRESSION: Surgical changes of right thyroidectomy.  No left-sided thyroid nodule meets criteria for biopsy or surveillance, as designated by the newly established ACR TI-RADS criteria.  Recommendations follow those established by the new ACR TI-RADS criteria (J Am Coll Radiol 8616;83:729-021).   Electronically Signed   By: Corrie Mckusick D.O.   On: 07/15/2018 15:45  Philemon Kingdom, MD PhD [facility] Endocrinology

## 2018-07-11 ENCOUNTER — Ambulatory Visit: Payer: BLUE CROSS/BLUE SHIELD | Admitting: Women's Health

## 2018-07-11 ENCOUNTER — Ambulatory Visit (INDEPENDENT_AMBULATORY_CARE_PROVIDER_SITE_OTHER): Payer: BLUE CROSS/BLUE SHIELD

## 2018-07-11 ENCOUNTER — Encounter: Payer: Self-pay | Admitting: Women's Health

## 2018-07-11 VITALS — Wt 129.0 lb

## 2018-07-11 DIAGNOSIS — R350 Frequency of micturition: Secondary | ICD-10-CM

## 2018-07-11 DIAGNOSIS — N898 Other specified noninflammatory disorders of vagina: Secondary | ICD-10-CM | POA: Diagnosis not present

## 2018-07-11 DIAGNOSIS — R1031 Right lower quadrant pain: Secondary | ICD-10-CM

## 2018-07-11 LAB — WET PREP FOR TRICH, YEAST, CLUE

## 2018-07-11 MED ORDER — IBUPROFEN 600 MG PO TABS: 600.0000 mg | ORAL_TABLET | Freq: Three times a day (TID) | ORAL | 1 refills | Status: DC | PRN

## 2018-07-11 NOTE — Progress Notes (Signed)
43 year old MHF G0 presents with right lower quadrant pain for the past 2 weeks, rates at a 5.  Had mild urinary frequency with a white discharge without itching or odor.  Denies pain with urination, nausea, back pain or fever.  Mild constipation, no change.  TAH with LSO for endometriosis, benign cyst and fibroids.  Masseuse, no change in routine.  Medical problems include hypothyroidism.  Interpreter present.  Exam: Appears well.  No CVAT.  Abdomen soft without rebound or radiation with deep palpation, external genitalia within normal limits without visible erythema, speculum exam no visible discharge or erythema, wet prep negative.  Bimanual no adnexal tenderness on right or left, area of discomfort appears to be higher than level of ovary. UA: Trace leukocytes, negative nitrites, 6-10 WBCs, no RBCs, 0-5 squamous epithelials, few bacteria Ultrasound: S/P hysterectomy and LSO.  T/V and T/a vaginal cuff normal.  Right ovary located near vaginal cuff.  Right ovary normal echo with positive CFD arterial blood flow seen.  Negative cul-de-sac.  Left adnexal negative.  Right lower quadrant pain  Plan: Reviewed normality of exam, UA, and ultrasound.  Reviewed possible endometriosis causing discomfort, or possible constipation causing some of the discomfort as well.  MiraLAX encouraged.  Motrin 600 mg 3 times daily as needed.  Instructed to call or return if continued problems.

## 2018-07-11 NOTE — Patient Instructions (Signed)
miralax take daily and then as needed   Dolor abdominal en los adultos Abdominal Pain, Adult  El dolor de [place] (abdominal) puede tener muchas causas. Towns veces, el dolor de [place] no es peligroso. Muchos de [employer] de dolor de estmago pueden controlarse y tratarse en casa. Sin embargo, a Clinical cytogeneticist, Conservation officer, historic buildings de [employer] puede ser grave. El mdico intentar descubrir la causa del dolor de Emily Ritter. Siga estas indicaciones en su casa:  Tome los medicamentos de venta libre y los recetados solamente como se lo haya indicado el mdico. No tome medicamentos que lo ayuden a Landscape architect (laxantes), salvo que el mdico se lo indique.  Beba suficiente lquido para mantener el pis (orina) claro o de color amarillo plido.  Est atento al dolor de estmago para Actuary cambio.  Concurra a todas las visitas de control como se lo haya indicado el mdico. Esto es importante. Comunquese con un mdico si:  El dolor de estmago cambia o [place].  No tiene apetito o baja de peso sin proponrselo.  Tiene dificultades para defecar (est estreido) o heces lquidas (diarrea) durante ms de 2 o 3das.  Siente dolor al orinar o defecar.  El dolor de estmago lo despierta de noche.  El dolor empeora con las comidas, despus de comer o con determinados alimentos.  Vomita y no puede retener nada de lo que ingiere.  Tiene fiebre. Solicite ayuda de inmediato si:  El dolor no desaparece en el tiempo indicado por el mdico.  No puede detener los vmitos.  Siente dolor solamente en zonas especficas del abdomen, como el lado derecho o la parte inferior izquierda.  Tiene heces con sangre, de color negro o con aspecto alquitranado.  Tiene dolor muy intenso en el vientre, clicos o meteorismo.  Presenta signos de no tener suficientes lquidos o agua en el cuerpo (deshidratacin), por ejemplo: ? [place] es [place], es muy escasa o no orina. ? Labios agrietados. ? [employer]. ? Ojos hundidos. ? Somnolencia. ? Debilidad. Esta informacin no tiene Marine scientist el consejo del mdico. Asegrese de hacerle al mdico cualquier pregunta que tenga. Document Released: 08/28/2008 Document Revised: 08/26/2016 Document Reviewed: 11/13/2015 Elsevier Interactive Patient Education  2019 [employer].

## 2018-07-13 LAB — URINALYSIS, COMPLETE W/RFL CULTURE
BILIRUBIN URINE: NEGATIVE
GLUCOSE, UA: NEGATIVE
HYALINE CAST: NONE SEEN /LPF
Hgb urine dipstick: NEGATIVE
KETONES UR: NEGATIVE
Nitrites, Initial: NEGATIVE
PROTEIN: NEGATIVE
RBC / HPF: NONE SEEN /HPF (ref 0–2)
Specific Gravity, Urine: 1.004 (ref 1.001–1.03)
pH: 5.5 (ref 5.0–8.0)

## 2018-07-13 LAB — URINE CULTURE
MICRO NUMBER:: 114740
Result:: NO GROWTH
SPECIMEN QUALITY:: ADEQUATE

## 2018-07-13 LAB — CULTURE INDICATED

## 2018-07-15 ENCOUNTER — Ambulatory Visit
Admission: RE | Admit: 2018-07-15 | Discharge: 2018-07-15 | Disposition: A | Discharge status: Home / Self Care | Payer: BLUE CROSS/BLUE SHIELD | Source: Ambulatory Visit | Attending: Internal Medicine | Admitting: Internal Medicine

## 2018-07-15 DIAGNOSIS — E042 Nontoxic multinodular goiter: Secondary | ICD-10-CM

## 2018-07-18 ENCOUNTER — Telehealth: Payer: Self-pay

## 2018-07-18 NOTE — Telephone Encounter (Signed)
-----   Message from Philemon Kingdom, MD sent at 07/15/2018  5:49 PM EST ----- Lenna Sciara, can you please call pt with a Spanish interpreter line: The results of the thyroid ultrasound are back and there is good news: She has 3 nodules but none of them appear to be worrisome.  She has 2 smaller ones in the 1 that this likely larger, but this appears to be a cyst and these are usually benign.  No intervention is needed for now.

## 2018-09-09 ENCOUNTER — Other Ambulatory Visit: Payer: Self-pay

## 2018-09-12 ENCOUNTER — Encounter: Payer: Self-pay | Admitting: Obstetrics & Gynecology

## 2018-09-12 ENCOUNTER — Ambulatory Visit (INDEPENDENT_AMBULATORY_CARE_PROVIDER_SITE_OTHER): Payer: BLUE CROSS/BLUE SHIELD | Admitting: Obstetrics & Gynecology

## 2018-09-12 VITALS — BP 122/76 | Ht 63.0 in | Wt 126.0 lb

## 2018-09-12 DIAGNOSIS — Z01419 Encounter for gynecological examination (general) (routine) without abnormal findings: Secondary | ICD-10-CM | POA: Diagnosis not present

## 2018-09-12 DIAGNOSIS — Z90710 Acquired absence of both cervix and uterus: Secondary | ICD-10-CM | POA: Diagnosis not present

## 2018-09-12 DIAGNOSIS — Z780 Asymptomatic menopausal state: Secondary | ICD-10-CM

## 2018-09-12 DIAGNOSIS — N949 Unspecified condition associated with female genital organs and menstrual cycle: Secondary | ICD-10-CM | POA: Diagnosis not present

## 2018-09-12 DIAGNOSIS — N9489 Other specified conditions associated with female genital organs and menstrual cycle: Secondary | ICD-10-CM

## 2018-09-12 LAB — WET PREP FOR TRICH, YEAST, CLUE

## 2018-09-12 MED ORDER — CLOBETASOL PROP EMOLLIENT BASE 0.05 % EX CREA: 1.0000 "application " | TOPICAL_CREAM | Freq: Every day | CUTANEOUS | 1 refills | Status: AC

## 2018-09-12 NOTE — Progress Notes (Signed)
Emily Ritter [birth year] [id number]   History:    43 y.o. G0  Stable boyfriend x 8 yrs (In [place] x 3 months, cannot come back because of Corona Virus)  RP:  Established patient presenting for annual gyn exam   HPI: TAH/Bilateral Salpingectomy/Left Oophorectomy 03/2015 in [place] because of Fibroids/Endometriosis.  No pelvic pain.  But occasional low back pain.  No pain with intercourse, abstinent x 3 months as boyfriend is in [place].  [place] menopausal at 144.1 in 08/2017.  Mild vulvar burning.    Family and social history were all reviewed and documented in the EPIC chart.  Gynecologic History Patient's last menstrual period was 02/17/2015. Contraception: status post hysterectomy Last Pap: 2019. Results were: Negative Last mammogram: 2 yrs ago normal, done in [place]. Bone Density: Never Colonoscopy: Never  Obstetric History OB History  Gravida Para Term Preterm AB Living  0 0 0 0 0 0  SAB TAB Ectopic Multiple Live Births  0 0 0 0       ROS: A ROS was performed and pertinent positives and negatives are included in the history.  GENERAL: No fevers or chills. HEENT: No change in vision, no earache, sore throat or sinus congestion. NECK: No pain or stiffness. CARDIOVASCULAR: No chest pain or pressure. No palpitations. PULMONARY: No shortness of breath, cough or wheeze. GASTROINTESTINAL: No abdominal pain, nausea, vomiting or diarrhea, melena or bright red blood per rectum. GENITOURINARY: No urinary frequency, urgency, hesitancy or dysuria. MUSCULOSKELETAL: No joint or muscle pain, no back pain, no recent trauma. DERMATOLOGIC: No rash, no itching, no lesions. ENDOCRINE: No polyuria, polydipsia, no heat or cold intolerance. No recent change in weight. HEMATOLOGICAL: No anemia or easy bruising or bleeding. NEUROLOGIC: No headache, seizures, numbness, tingling or weakness. PSYCHIATRIC: No depression, no loss of interest in normal activity or change in sleep pattern.      Exam:   LMP 02/17/2015   There is no height or weight on file to calculate BMI.  General appearance : Well developed well nourished female. No acute distress HEENT: Eyes: no retinal hemorrhage or exudates,  Neck supple, trachea midline, no carotid bruits, no thyroidmegaly Lungs: Clear to auscultation, no rhonchi or wheezes, or rib retractions  Heart: Regular rate and rhythm, no murmurs or gallops Breast:Examined in sitting and supine position were symmetrical in appearance, no palpable masses or tenderness,  no skin retraction, no nipple inversion, no nipple discharge, no skin discoloration, no axillary or supraclavicular lymphadenopathy Abdomen: no palpable masses or tenderness, no rebound or guarding Extremities: no edema or skin discoloration or tenderness  Pelvic: Vulva: Normal             Vagina: No gross lesions or discharge.  Mild erythema around hymen.  Cervix/Uterus absent  Adnexa  Without masses or tenderness  Anus: Normal  Wet prep negative   Assessment/Plan:  43 y.o. female for annual exam   1. Well female exam with routine gynecological exam Gynecologic exam status post TAH/LS0.  Pap test in March 2019 was negative, no indication to repeat today.  Breast exam normal.  Will schedule a screening mammogram of the breast center now.  Fasting health labs here today. - CBC - Comp Met (CMET) - Lipid panel - VITAMIN D 25 Hydroxy (Vit-D Deficiency, Fractures) - TSH  2. S/P TAH (total abdominal hysterectomy)  3. Menopause Well on no hormone replacement therapy.  Recommend vitamin D supplements, calcium intake of 1.5 g/day total and regular weightbearing physical activities.  4.  Vaginal burning Normal vaginal and vulvar exam.  Wet prep negative.  Patient reassured and counseling done. - WET PREP FOR [place], YEAST, CLUE  Other orders - Nutritional Supplements (ESTROVEN PM PO); Take by mouth. - Clobetasol Prop Emollient Base (CLOBETASOL PROPIONATE E) 0.05 % emollient  cream; Apply 1 application topically daily for 14 days.  Counseling on above issues and coordination of care more than 50% for 10 minutes.  Princess Bruins MD, 10:46 AM 09/12/2018

## 2018-09-13 LAB — COMPREHENSIVE METABOLIC PANEL
AG RATIO: 1.6 (calc) (ref 1.0–2.5)
ALT: 18 U/L (ref 6–29)
AST: 21 U/L (ref 10–30)
Albumin: 4.7 g/dL (ref 3.6–5.1)
Alkaline phosphatase (APISO): 68 U/L (ref 31–125)
BUN: 11 mg/dL (ref 7–25)
CALCIUM: 9.8 mg/dL (ref 8.6–10.2)
CO2: 27 mmol/L (ref 20–32)
Chloride: 105 mmol/L (ref 98–110)
Creat: 0.56 mg/dL (ref 0.50–1.10)
GLOBULIN: 2.9 g/dL (ref 1.9–3.7)
Glucose, Bld: 75 mg/dL (ref 65–99)
Potassium: 4 mmol/L (ref 3.5–5.3)
Sodium: 142 mmol/L (ref 135–146)
Total Bilirubin: 0.9 mg/dL (ref 0.2–1.2)
Total Protein: 7.6 g/dL (ref 6.1–8.1)

## 2018-09-13 LAB — LIPID PANEL
Cholesterol: 208 mg/dL — ABNORMAL HIGH (ref <200)
HDL: 70 mg/dL (ref >=50)
LDL Cholesterol (Calc): 124 mg/dL (calc) — ABNORMAL HIGH
Non-HDL Cholesterol (Calc): 138 mg/dL (calc) — ABNORMAL HIGH (ref <130)
Total CHOL/HDL Ratio: 3 (calc) (ref <5.0)
Triglycerides: 52 mg/dL (ref <150)

## 2018-09-13 LAB — CBC
HCT: 39.9 % (ref 35.0–45.0)
Hemoglobin: 13.4 g/dL (ref 11.7–15.5)
MCH: 29.6 pg (ref 27.0–33.0)
MCHC: 33.6 g/dL (ref 32.0–36.0)
MCV: 88.1 fL (ref 80.0–100.0)
MPV: 9 fL (ref 7.5–12.5)
Platelets: 388 10*3/uL (ref 140–400)
RBC: 4.53 10*6/uL (ref 3.80–5.10)
RDW: 12.9 % (ref 11.0–15.0)
WBC: 4.2 10*3/uL (ref 3.8–10.8)

## 2018-09-13 LAB — TSH: TSH: 1.15 mIU/L

## 2018-09-13 LAB — VITAMIN D 25 HYDROXY (VIT D DEFICIENCY, FRACTURES): Vit D, 25-Hydroxy: 90 ng/mL (ref 30–100)

## 2018-09-15 ENCOUNTER — Encounter: Payer: Self-pay | Admitting: Obstetrics & Gynecology

## 2018-09-15 NOTE — Patient Instructions (Signed)
1. Well female exam with routine gynecological exam Gynecologic exam status post TAH/LS0.  Pap test in March 2019 was negative, no indication to repeat today.  Breast exam normal.  Will schedule a screening mammogram of the breast center now.  Fasting health labs here today. - CBC - Comp Met (CMET) - Lipid panel - VITAMIN D 25 Hydroxy (Vit-D Deficiency, Fractures) - TSH  2. S/P TAH (total abdominal hysterectomy)  3. Menopause Well on no hormone replacement therapy.  Recommend vitamin D supplements, calcium intake of 1.5 g/day total and regular weightbearing physical activities.  4. Vaginal burning Normal vaginal and vulvar exam.  Wet prep negative.  Patient reassured and counseling done. - WET PREP FOR [place], YEAST, CLUE  Other orders - Nutritional Supplements (ESTROVEN PM PO); Take by mouth. - Clobetasol Prop Emollient Base (CLOBETASOL PROPIONATE E) 0.05 % emollient cream; Apply 1 application topically daily for 14 days.  Azucena Kuba, fue un placer verle hoy!

## 2018-11-18 ENCOUNTER — Telehealth: Payer: Self-pay

## 2018-11-18 DIAGNOSIS — Z20828 Contact with and (suspected) exposure to other viral communicable diseases: Secondary | ICD-10-CM | POA: Diagnosis not present

## 2018-11-18 NOTE — Telephone Encounter (Signed)
Yes, schedule at her convenience

## 2018-11-18 NOTE — Telephone Encounter (Signed)
Copied from [place] [id number]. Topic: Appointment Scheduling - Scheduling Inquiry for Clinic >> Nov 18, 2018 10:37 AM Scherrie Gerlach wrote: Reason for CRM: pt would like to know if Dr Larose Kells will accept her back as a pt?  It has been over 3 yrs.

## 2018-11-18 NOTE — Telephone Encounter (Signed)
Please advise 

## 2018-11-18 NOTE — Telephone Encounter (Signed)
Okay to schedule appt at her convenience. Thank you.

## 2018-11-18 NOTE — Telephone Encounter (Signed)
Appt scheduled 11/24/2018.

## 2018-11-24 ENCOUNTER — Ambulatory Visit (INDEPENDENT_AMBULATORY_CARE_PROVIDER_SITE_OTHER): Payer: BC Managed Care – PPO | Admitting: Internal Medicine

## 2018-11-24 ENCOUNTER — Encounter: Payer: Self-pay | Admitting: Internal Medicine

## 2018-11-24 ENCOUNTER — Other Ambulatory Visit: Payer: Self-pay

## 2018-11-24 DIAGNOSIS — Z20828 Contact with and (suspected) exposure to other viral communicable diseases: Secondary | ICD-10-CM | POA: Diagnosis not present

## 2018-11-24 DIAGNOSIS — E039 Hypothyroidism, unspecified: Secondary | ICD-10-CM | POA: Diagnosis not present

## 2018-11-24 DIAGNOSIS — Z20822 Contact with and (suspected) exposure to covid-19: Secondary | ICD-10-CM

## 2018-11-24 NOTE — Assessment & Plan Note (Signed)
COVID-19 exposure: As described above, patient is asymptomatic, recommend to continue checking her temperature, continue good precautions. After 2 weeks of exposure if she remains essentially asymptomatic she will stop being contagious. Same for her niece. She wonders about future retesting, at this point is not recommended but we could consider Covid  IgG   in the future. Hypothyroidism: Good compliance with medication, TSH few weeks ago normal Social: Patient shares house w/ her  niece RTC 3 to 4 months CPX, will call

## 2018-11-24 NOTE — Progress Notes (Signed)
Subjective:    Patient ID: Emily Ritter, female    DOB: 29-Apr-1976, 43 y.o.   MRN: [id number]  DOS:  11/24/2018 Type of visit - description: Virtual Visit via Video Note  I connected with@ on 11/24/18 at 10:20 AM EDT by a video enabled telemedicine application and verified that I am speaking with the correct person using two identifiers.   THIS ENCOUNTER IS A VIRTUAL VISIT DUE TO COVID-19 - PATIENT WAS NOT SEEN IN THE OFFICE. PATIENT HAS CONSENTED TO VIRTUAL VISIT / TELEMEDICINE VISIT   Location of patient: home  Location of provider: office  I discussed the limitations of evaluation and management by telemedicine and the availability of in person appointments. The patient expressed understanding and agreed to proceed.  History of Present Illness: Acute, new patient On 11/10/2018, he was in contact with multiple people, at least 2 of them tested positive for COVID-19. Because of that, on June 5 she decided to get tested along with her roommate. Emily Ritter test came back negative but her roommate ( a niece) came back positive for COVID-19.   Her niece is completely asymptomatic, Emily Ritter has a mild sore throat;she checks her temperature regularly and has no fever. Since they learn the niece is Covid +, they have been even more strict with social isolation within the same house.   Review of Systems No chest pain no difficulty breathing No nausea, vomiting, diarrhea No headache  Past Medical History:  Diagnosis Date  . Anxiety   . Anxiety and depression   . Asthma   . Endometriosis   . Frequent headaches   . Hypothyroidism   . Melasma   . Vitamin D deficiency     Past Surgical History:  Procedure Laterality Date  . ABDOMINAL HYSTERECTOMY  03/2015   done in [place], [place] has R ovary  . DIAGNOSTIC LAPAROSCOPY  1999   endometriosis  . LAPAROSCOPIC OVARIAN CYSTECTOMY  2002  . OOPHORECTOMY Left 03-2015  . TAH/LSO    . THYROID SURGERY  2004   lump removed    Social History    Socioeconomic History  . Marital status: Single    Spouse name: Not on file  . Number of children: 0  . Years of education: Not on file  . Highest education level: Not on file  Occupational History  . 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 Financial resource strain: Not on file  . Food insecurity    Worry: Not on file    Inability: Not on file  . Transportation needs    Medical: Not on file    Non-medical: Not on file  Tobacco Use  . Smoking status: Never Smoker  . Smokeless tobacco: Never Used  Substance and Sexual Activity  . Alcohol use: No    Alcohol/week: 0.0 standard drinks  . Drug use: No  . Sexual activity: Yes    Partners: Male    Birth control/protection: None  Lifestyle  . Physical activity    Days per week: Not on file    Minutes per session: Not on file  . Stress: Not on file  Relationships  . Social Herbalist on phone: Not on file    Gets together: Not on file    Attends religious service: Not on file    Active member of club or organization: Not on file    Attends meetings of clubs or organizations: Not on file    Relationship status: Not on file  .  Intimate partner violence    Fear of current or ex partner: Not on file    Emotionally abused: Not on file    Physically abused: Not on file    Forced sexual activity: Not on file  Other Topics Concern  . Not on file  Social History Narrative   Original from [place], moved to [place] ~ 2005   Divorced, lives w/ her nice     Family History  Problem Relation Age of Onset  . Hypertension Mother   . Hyperlipidemia Mother   . Hypertension Father   . Heart disease Father        onset age 31, stent , CABG  . Hyperlipidemia Father   . Uterine cancer Paternal Aunt   . Colon cancer Neg Hx   . Breast cancer Neg Hx      Allergies as of 11/24/2018      Reactions   Penicillins Other (See Comments)   Unknown   Amoxicillin Rash      Medication List       Accurate as of November 24, 2018  4:52 PM. If you have any questions, ask your nurse or doctor.        STOP taking these medications   ESTROVEN PM PO Stopped by: Kathlene November, MD   ibuprofen 600 MG tablet Commonly known as: ADVIL Stopped by: Kathlene November, MD     TAKE these medications   levothyroxine 75 MCG tablet Commonly known as: SYNTHROID Take 1 tablet (75 mcg total) by mouth daily before breakfast.   multivitamin tablet Take 1 tablet by mouth daily.           Objective:   Physical Exam LMP 02/17/2015  This is a virtual video visit.  Alert oriented x3, no apparent distress    Assessment     Assessment Hypothyroidism Anxiety depression Rosacea  Vitamin D deficiency Frequent headaches H/o asthma  H/o endometriosis, s/p hysterectomy, still has R ovary   PLAN COVID-19 exposure: As described above, patient is asymptomatic, recommend to continue checking her temperature, continue good precautions. After 2 weeks of exposure if she remains essentially asymptomatic she will stop being contagious. Same for her niece. She wonders about future retesting, at this point is not recommended but we could consider Covid  IgG   in the future. Hypothyroidism: Good compliance with medication, TSH few weeks ago normal Social: Patient shares house w/ her  niece RTC 3 to 4 months CPX, will call    I discussed the assessment and treatment plan with the patient. The patient was provided an opportunity to ask questions and all were answered. The patient agreed with the plan and demonstrated an understanding of the instructions.   The patient was advised to call back or seek an in-person evaluation if the symptoms worsen or if the condition fails to improve as anticipated.

## 2019-05-17 ENCOUNTER — Other Ambulatory Visit: Payer: Self-pay

## 2019-05-17 DIAGNOSIS — Z20822 Contact with and (suspected) exposure to covid-19: Secondary | ICD-10-CM

## 2019-05-19 ENCOUNTER — Ambulatory Visit (INDEPENDENT_AMBULATORY_CARE_PROVIDER_SITE_OTHER): Payer: BC Managed Care – PPO | Admitting: Internal Medicine

## 2019-05-19 ENCOUNTER — Other Ambulatory Visit: Payer: Self-pay

## 2019-05-19 DIAGNOSIS — U071 COVID-19: Secondary | ICD-10-CM

## 2019-05-19 LAB — NOVEL CORONAVIRUS, NAA: SARS-CoV-2, NAA: NOT DETECTED

## 2019-05-19 NOTE — Progress Notes (Signed)
Subjective:    Patient ID: Emily Ritter, female    DOB: [birth year], 43 y.o.   MRN: [id number]  DOS:  05/19/2019 Type of visit - description: Virtual Visit via Video Note  I connected with@   by a video enabled telemedicine application and verified that I am speaking with the correct person using two identifiers.   THIS ENCOUNTER IS A VIRTUAL VISIT DUE TO COVID-19 - PATIENT WAS NOT SEEN IN THE OFFICE. PATIENT HAS CONSENTED TO VIRTUAL VISIT / TELEMEDICINE VISIT   Location of patient: home  Location of provider: office  I discussed the limitations of evaluation and management by telemedicine and the availability of in person appointments. The patient expressed understanding and agreed to proceed.  History of Present Illness: Acute Exposed to COVID-19 04/29/2019 Symptoms started 05/03/2019: Had fever for 2 days, myalgias, persisting headache, lack of taste, bleeding from the gums. No rash, no cough/chest pain/shortness of breath No nausea/vomiting/diarrhea No hematuria.  Was tested + 05/04/2019 Retested a couple of days ago and it was negative. She is calling because she has still some fatigue and a mild headache but overall much improved.    Review of Systems Currently with no fever, chest pain, shortness of breath No further gum bleeding, no rash or gross hematuria  Past Medical History:  Diagnosis Date  . Anxiety   . Anxiety and depression   . Asthma   . Endometriosis   . Frequent headaches   . Hypothyroidism   . Melasma   . Vitamin D deficiency     Past Surgical History:  Procedure Laterality Date  . ABDOMINAL HYSTERECTOMY  03/2015   done in [place], [place] has R ovary  . DIAGNOSTIC LAPAROSCOPY  1999   endometriosis  . LAPAROSCOPIC OVARIAN CYSTECTOMY  2002  . OOPHORECTOMY Left 03-2015  . TAH/LSO    . THYROID SURGERY  2004   lump removed    Social History   Socioeconomic History  . Marital status: Single    Spouse name: Not on file  . Number of  children: 0  . Years of education: Not on file  . Highest education level: Not on file  Occupational History  . 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 Financial resource strain: Not on file  . Food insecurity    Worry: Not on file    Inability: Not on file  . Transportation needs    Medical: Not on file    Non-medical: Not on file  Tobacco Use  . Smoking status: Never Smoker  . Smokeless tobacco: Never Used  Substance and Sexual Activity  . Alcohol use: No    Alcohol/week: 0.0 standard drinks  . Drug use: No  . Sexual activity: Yes    Partners: Male    Birth control/protection: None  Lifestyle  . Physical activity    Days per week: Not on file    Minutes per session: Not on file  . Stress: Not on file  Relationships  . Social Herbalist on phone: Not on file    Gets together: Not on file    Attends religious service: Not on file    Active member of club or organization: Not on file    Attends meetings of clubs or organizations: Not on file    Relationship status: Not on file  . Intimate partner violence    Fear of current or ex partner: Not on file    Emotionally abused: Not on file  Physically abused: Not on file    Forced sexual activity: Not on file  Other Topics Concern  . Not on file  Social History Narrative   Original from [place], moved to [place] ~ 2005   Divorced, lives w/ her nice      Allergies as of 05/19/2019      Reactions   Penicillins Other (See Comments)   Unknown   Amoxicillin Rash      Medication List       Accurate as of May 19, 2019 11:07 AM. If you have any questions, ask your nurse or doctor.        levothyroxine 75 MCG tablet Commonly known as: SYNTHROID Take 1 tablet (75 mcg total) by mouth daily before breakfast.   multivitamin tablet Take 1 tablet by mouth daily.           Objective:   Physical Exam LMP 02/17/2015  This is a virtual video visit, she is alert oriented x3 and in no apparent  distress.    Assessment     Assessment Hypothyroidism Anxiety depression Rosacea  Vitamin D deficiency Frequent headaches H/o asthma  H/o endometriosis, s/p hysterectomy, still has R ovary   PLAN COVID-19: As described above, she is improving, still having some fatigue and a mild headache.  Overall things are going in the right direction, she is able to go back to work if so desired. Recommend good hydration, Tylenol as needed for headaches.  Also pay attention, if she has high fever, chest pain, headache increases, has a rash or any bleeding she is to let me know. We will call her and schedule a CPX, she is due    I discussed the assessment and treatment plan with the patient. The patient was provided an opportunity to ask questions and all were answered. The patient agreed with the plan and demonstrated an understanding of the instructions.   The patient was advised to call back or seek an in-person evaluation if the symptoms worsen or if the condition fails to improve as anticipated.

## 2019-05-20 NOTE — Assessment & Plan Note (Signed)
COVID-19: As described above, she is improving, still having some fatigue and a mild headache.  Overall things are going in the right direction, she is able to go back to work if so desired. Recommend good hydration, Tylenol as needed for headaches.  Also pay attention, if she has high fever, chest pain, headache increases, has a rash or any bleeding she is to let me know. We will call her and schedule a CPX, she is due

## 2019-05-26 ENCOUNTER — Other Ambulatory Visit: Payer: Self-pay

## 2019-05-29 ENCOUNTER — Ambulatory Visit (HOSPITAL_BASED_OUTPATIENT_CLINIC_OR_DEPARTMENT_OTHER)
Admission: RE | Admit: 2019-05-29 | Discharge: 2019-05-29 | Disposition: A | Discharge status: Home / Self Care | Payer: BC Managed Care – PPO | Source: Ambulatory Visit | Attending: Internal Medicine | Admitting: Internal Medicine

## 2019-05-29 ENCOUNTER — Other Ambulatory Visit: Payer: Self-pay

## 2019-05-29 ENCOUNTER — Ambulatory Visit (INDEPENDENT_AMBULATORY_CARE_PROVIDER_SITE_OTHER): Payer: BC Managed Care – PPO | Admitting: Internal Medicine

## 2019-05-29 ENCOUNTER — Encounter: Payer: Self-pay | Admitting: Internal Medicine

## 2019-05-29 VITALS — BP 112/64 | HR 64 | Temp 97.0°F | Resp 18 | Ht 63.0 in | Wt 126.0 lb

## 2019-05-29 DIAGNOSIS — E039 Hypothyroidism, unspecified: Secondary | ICD-10-CM

## 2019-05-29 DIAGNOSIS — Z1231 Encounter for screening mammogram for malignant neoplasm of breast: Secondary | ICD-10-CM

## 2019-05-29 DIAGNOSIS — Z0000 Encounter for general adult medical examination without abnormal findings: Secondary | ICD-10-CM | POA: Diagnosis not present

## 2019-05-29 LAB — CBC WITH DIFFERENTIAL/PLATELET
Basophils Absolute: 0 10*3/uL (ref 0.0–0.1)
Basophils Relative: 0.4 % (ref 0.0–3.0)
Eosinophils Absolute: 0.3 10*3/uL (ref 0.0–0.7)
Eosinophils Relative: 5.2 % — ABNORMAL HIGH (ref 0.0–5.0)
HCT: 39.3 % (ref 36.0–46.0)
Hemoglobin: 13 g/dL (ref 12.0–15.0)
Lymphocytes Relative: 31.9 % (ref 12.0–46.0)
Lymphs Abs: 1.5 10*3/uL (ref 0.7–4.0)
MCHC: 33.1 g/dL (ref 30.0–36.0)
MCV: 89.4 fl (ref 78.0–100.0)
Monocytes Absolute: 0.3 10*3/uL (ref 0.1–1.0)
Monocytes Relative: 7.1 % (ref 3.0–12.0)
Neutro Abs: 2.7 10*3/uL (ref 1.4–7.7)
Neutrophils Relative %: 55.4 % (ref 43.0–77.0)
Platelets: 365 10*3/uL (ref 150.0–400.0)
RBC: 4.4 Mil/uL (ref 3.87–5.11)
RDW: 13.4 % (ref 11.5–15.5)
WBC: 4.8 10*3/uL (ref 4.0–10.5)

## 2019-05-29 LAB — LIPID PANEL
Cholesterol: 195 mg/dL (ref 0–200)
HDL: 57.1 mg/dL (ref >39.00)
LDL Cholesterol: 128 mg/dL — ABNORMAL HIGH (ref 0–99)
NonHDL: 137.59
Total CHOL/HDL Ratio: 3
Triglycerides: 50 mg/dL (ref 0.0–149.0)
VLDL: 10 mg/dL (ref 0.0–40.0)

## 2019-05-29 LAB — COMPREHENSIVE METABOLIC PANEL
ALT: 23 U/L (ref 0–35)
AST: 19 U/L (ref 0–37)
Albumin: 4.4 g/dL (ref 3.5–5.2)
Alkaline Phosphatase: 73 U/L (ref 39–117)
BUN: 15 mg/dL (ref 6–23)
CO2: 29 mEq/L (ref 19–32)
Calcium: 9.7 mg/dL (ref 8.4–10.5)
Chloride: 106 mEq/L (ref 96–112)
Creatinine, Ser: 0.52 mg/dL (ref 0.40–1.20)
GFR: 127.75 mL/min (ref >60.00)
Glucose, Bld: 83 mg/dL (ref 70–99)
Potassium: 4.1 mEq/L (ref 3.5–5.1)
Sodium: 142 mEq/L (ref 135–145)
Total Bilirubin: 0.5 mg/dL (ref 0.2–1.2)
Total Protein: 7.2 g/dL (ref 6.0–8.3)

## 2019-05-29 LAB — TSH: TSH: 1.89 u[IU]/mL (ref 0.35–4.50)

## 2019-05-29 NOTE — Progress Notes (Signed)
Subjective:    Patient ID: Emily Ritter, female    DOB: [birth year], 43 y.o.   MRN: [id number]  DOS:  05/29/2019 Type of visit - description: CPX In general feeling well. She had COVID-19 last month, she feels it is fully recuperated except for  mild myalgias and fatigue when she went [place] (I am not surprised, she did some difficult hiking)  Review of Systems  Other than above, a 14 point review of systems is negative     Past Medical History:  Diagnosis Date  . Anxiety and depression   . Asthma   . Endometriosis   . Frequent headaches   . Hypothyroidism   . Melasma   . Vitamin D deficiency     Past Surgical History:  Procedure Laterality Date  . ABDOMINAL HYSTERECTOMY  03/2015   done in [place], [place] has R ovary  . DIAGNOSTIC LAPAROSCOPY  1999   endometriosis  . LAPAROSCOPIC OVARIAN CYSTECTOMY  2002  . OOPHORECTOMY Left 03-2015  . TAH/LSO    . THYROID SURGERY  2004   lump removed    Social History   Socioeconomic History  . Marital status: Single    Spouse name: Not on file  . Number of children: 0  . Years of education: Not on file  . Highest education level: Not on file  Occupational History  . Occupation:  [employer]   Tobacco Use  . Smoking status: Never Smoker  . Smokeless tobacco: Never Used  Substance and Sexual Activity  . Alcohol use: No    Alcohol/week: 0.0 standard drinks  . Drug use: No  . Sexual activity: Yes    Partners: Male    Birth control/protection: None  Other Topics Concern  . Not on file  Social History Narrative   Original from [place], moved to [place] ~ 2005   Divorced, lives w/ her nice   Social Determinants of Radio broadcast assistant Strain:   . Difficulty of Paying Living Expenses: Not on file  Food Insecurity:   . Worried About Charity fundraiser in the Last Year: Not on file  . Ran Out of Food in the Last Year: Not on file  Transportation Needs:   . Lack of Transportation  (Medical): Not on file  . Lack of Transportation (Non-Medical): Not on file  Physical Activity:   . Days of Exercise per Week: Not on file  . Minutes of Exercise per Session: Not on file  Stress:   . Feeling of Stress : Not on file  Social Connections:   . Frequency of Communication with Friends and Family: Not on file  . Frequency of Social Gatherings with Friends and Family: Not on file  . Attends Religious Services: Not on file  . Active Member of Clubs or Organizations: Not on file  . Attends Archivist Meetings: Not on file  . Marital Status: Not on file  Intimate Partner Violence:   . Fear of Current or Ex-Partner: Not on file  . Emotionally Abused: Not on file  . Physically Abused: Not on file  . Sexually Abused: Not on file     Family History  Problem Relation Age of Onset  . Hypertension Mother   . Hyperlipidemia Mother   . Hypertension Father   . Heart disease Father        onset age 66, stent , CABG  . Hyperlipidemia Father   . Uterine cancer  Paternal Aunt   . Colon cancer Neg Hx   . Breast cancer Neg Hx   . Ovarian cancer Neg Hx      Allergies as of 05/29/2019      Reactions   Penicillins Other (See Comments)   Unknown   Amoxicillin Rash      Medication List       Accurate as of May 29, 2019  4:54 PM. If you have any questions, ask your nurse or doctor.        levothyroxine 75 MCG tablet Commonly known as: SYNTHROID Take 1 tablet (75 mcg total) by mouth daily before breakfast.   multivitamin tablet Take 1 tablet by mouth daily.           Objective:   Physical Exam BP 112/64 (BP Location: Left Arm, Patient Position: Sitting, Cuff Size: Small)   Pulse 64   Temp (!) 97 F (36.1 C) (Temporal)   Resp 18   Ht 5\' 3"  (1.6 m)   Wt 126 lb (57.2 kg)   LMP 02/17/2015   SpO2 100%   BMI 22.32 kg/m  General: Well developed, NAD, BMI noted Neck: No  thyromegaly  HEENT:  Normocephalic . Face symmetric, atraumatic Lungs:  CTA  B Normal respiratory effort, no intercostal retractions, no accessory muscle use. Heart: RRR,  no murmur.  No pretibial edema bilaterally  Abdomen:  Not distended, soft, non-tender. No rebound or rigidity.   Skin: Exposed areas without rash. Not pale. Not jaundice Neurologic:  alert & oriented X3.  Speech normal, gait appropriate for age and unassisted Strength symmetric and appropriate for age.  Psych: Cognition and judgment appear intact.  Cooperative with normal attention span and concentration.  Behavior appropriate. No anxious or depressed appearing.     Assessment   Assessment Hypothyroidism Anxiety depression Rosacea  Vitamin D deficiency Frequent headaches H/o asthma  H/o endometriosis, s/p hysterectomy, still has R ovary  COVID-19 04-2019  PLAN Here for CPX Hypothyroidism: Follow-up by Endo but patient request a TSH.  Will do Thyroid nodules: Per last ultrasound, recommend to follow with endocrinology. COVID-19: Basically fully recovered.  See HPI. RTC 1 year   This visit occurred during the SARS-CoV-2 public health emergency.  Safety protocols were in place, including screening questions prior to the visit, additional usage of staff PPE, and extensive cleaning of exam room while observing appropriate contact time as indicated for disinfecting solutions.

## 2019-05-29 NOTE — Patient Instructions (Signed)
GO TO THE LAB : Get the blood work     GO TO THE FRONT DESK Schedule your next appointment   for a physical exam in 1 year   You can schedule your mammogram at the first floor

## 2019-05-29 NOTE — Assessment & Plan Note (Signed)
Td 2017, flu shot d/w pt, declined , benefits discussed  CCS:  s/p Cscope ~ July 2014, dx w/ internal hemorrhoids Dr Ardis Hughs Female care: S/p hysterectomy, no malignancy per pt, had endometriosis Saw gyn 08/2018  Never had a MMG in the [place], apparently had one in [place], [place] ordered today Diet and exercise discussed although she is doing very well Labs: CMP, CBC, TSH, FLP

## 2019-05-29 NOTE — Progress Notes (Signed)
Pre visit review using our clinic review tool, if applicable. No additional management support is needed unless otherwise documented below in the visit note. 

## 2019-05-29 NOTE — Assessment & Plan Note (Signed)
Here for CPX Hypothyroidism: Follow-up by Endo but patient request a TSH.  Will do Thyroid nodules: Per last ultrasound, recommend to follow with endocrinology. COVID-19: Basically fully recovered.  See HPI. RTC 1 year

## 2019-06-12 ENCOUNTER — Other Ambulatory Visit: Payer: Self-pay

## 2019-06-13 ENCOUNTER — Ambulatory Visit: Payer: BC Managed Care – PPO | Admitting: Women's Health

## 2019-06-13 ENCOUNTER — Encounter: Payer: Self-pay | Admitting: Women's Health

## 2019-06-13 DIAGNOSIS — R1031 Right lower quadrant pain: Secondary | ICD-10-CM

## 2019-06-13 DIAGNOSIS — N898 Other specified noninflammatory disorders of vagina: Secondary | ICD-10-CM | POA: Diagnosis not present

## 2019-06-13 LAB — WET PREP FOR TRICH, YEAST, CLUE

## 2019-06-13 MED ORDER — FLUCONAZOLE 150 MG PO TABS: 150.0000 mg | ORAL_TABLET | Freq: Once | ORAL | 1 refills | Status: AC

## 2019-06-13 NOTE — Patient Instructions (Addendum)
Candidiasis vaginal en los adultos Vaginal Yeast infection, Adult  La infeccin mictica vaginal es una afeccin que causa secrecin vaginal y Garment/textile technologist, hinchazn y enrojecimiento (inflamacin) de la vagina. Esta es una enfermedad frecuente. Algunas mujeres contraen esta infeccin con frecuencia. Cules son las causas? La causa de la infeccin es un cambio en el equilibrio normal de los hongos (cndida) y las bacterias que viven en la vagina. Esta alteracin deriva en el crecimiento excesivo de los hongos, lo que causa la inflamacin. Qu incrementa el riesgo? La afeccin es ms probable en las mujeres que tienen estas caractersticas:  Toman antibiticos.  Tienen diabetes.  Toman anticonceptivos orales.  Estn embarazadas.  Se hacen duchas vaginales con frecuencia.  Tienen debilitado el sistema de defensa del organismo (sistema inmunitario).  Han estado tomando medicamentos con corticoesteroides durante [employer].  Usan ropa ajustada con frecuencia. Cules son los signos o los sntomas? Los sntomas de esta afeccin incluyen los siguientes:  Secrecin vaginal blanca, cremosa y espesa.  Hinchazn, picazn, enrojecimiento e irritacin de la vagina. Los labios de la vagina (vulva) tambin se pueden infectar.  Dolor o ardor al Garment/textile technologist.  [place]. Cmo se diagnostica? Esta afeccin se diagnostica en funcin de lo siguiente:  Sus antecedentes mdicos.  Un examen fsico.  Examen plvico. El mdico examinar una muestra de la secrecin vaginal con un microscopio. Probablemente el mdico enve esta muestra al laboratorio para analizarla y confirmar el diagnstico. Cmo se trata? Esta afeccin se trata con medicamentos. [employer] pueden ser recetados o de venta libre. Podrn indicarle que use uno o ms de lo siguiente:  Medicamentos que se toman por boca (orales).  Medicamentos que se aplican como una crema  (tpicos).  Medicamentos que se colocan directamente en la vagina (vulos vaginales). Siga estas indicaciones en su casa:  Estilo de vida  No tenga relaciones sexuales hasta que el mdico lo autorice. Comunique a su compaero sexual que tiene una infeccin por hongos. Esa persona debera visitar a su mdico y preguntarle si debera recibir tratamiento tambin.  No use ropa ajustada, como pantimedias o pantalones ajustados.  Use ropa interior de algodn, que permite el paso del aire. Indicaciones generales  Tome o aplquese los medicamentos de venta libre y los recetados solamente como se lo haya indicado el mdico.  Consuma ms yogur. Esto puede ayudar a Technical brewer de la candidiasis.  No use tampones hasta que el mdico la autorice.  Intente darse un bao de asiento para [employer]. Se trata de un bao de agua tibia que se toma mientras se est sentado. El agua solo debe Systems analyst las caderas y cubrir las nalgas. Hgalo 3o 4veces al da o como se lo haya indicado el mdico.  No se haga duchas vaginales.  Si tiene diabetes, mantenga bajo control los niveles de Dispensing optician.  Concurra a todas las visitas de control como se lo haya indicado el mdico. Esto es importante. Comunquese con un mdico si:  Tiene fiebre.  Los sntomas desaparecen y luego vuelven a Arts administrator.  Los sntomas no mejoran con Dispensing optician.  Sus sntomas empeoran.  Aparecen nuevos sntomas.  Aparecen ampollas alrededor o adentro de la vagina.  Le sale sangre de la vagina y no est menstruando.  Siente dolor en el abdomen. Resumen  La infeccin mictica vaginal es una afeccin que causa secrecin y Garment/textile technologist, hinchazn y enrojecimiento (inflamacin) de la vagina.  Esta afeccin se trata con medicamentos. Los  medicamentos pueden ser recetados o de venta libre.  Tome o aplquese los medicamentos de venta libre y los recetados solamente como se lo haya indicado el  mdico.  No se haga duchas vaginales. No tenga relaciones sexuales ni use tampones hasta que el mdico la autorice.  Comunquese con un mdico si los sntomas no mejoran con el tratamiento o si los sntomas desaparecen y [employer]. Esta informacin no tiene Marine scientist el consejo del mdico. Asegrese de hacerle al mdico cualquier pregunta que tenga. Document Released: 03/11/2005 Document Revised: 12/09/2017 Document Reviewed: 12/09/2017 Elsevier Patient Education  [place] abdominal en adultos Abdominal Pain, Adult El dolor abdominal puede tener muchas causas. A menudo, no es grave y [place] sin tratamiento o con tratamiento en la casa. Sin embargo, a Product/process development scientist abdominal es intenso. El mdico revisar sus antecedentes mdicos y le har un examen fsico para tratar de Office manager causa del dolor abdominal. Siga estas instrucciones en su casa:  Tome los medicamentos de venta libre y los recetados solamente como se lo haya indicado el mdico. No tome un laxante a menos que se lo haya indicado el mdico.  Beba suficiente lquido para Theatre manager la orina clara o de color amarillo plido.  Controle su afeccin para ver si hay cambios.  Concurra a todas las visitas de control como se lo haya indicado el mdico. Esto es importante. Comunquese con un mdico si:  El dolor abdominal cambia o empeora.  No tiene apetito o baja de peso sin proponrselo.  Est estreido o tiene diarrea durante ms de 2 o 3das.  Tiene dolor cuando orina o defeca.  El dolor abdominal lo despierta de noche.  El dolor empeora con las comidas, despus de comer o con determinados alimentos.  Tiene vmitos y no puede retener nada.  Tiene fiebre. Solicite ayuda de inmediato si:  El dolor no desaparece tan pronto como el mdico le dijo que era esperable.  No puede detener los vmitos.  El Social research officer, government se siente solo en zonas del abdomen, como el lado derecho o la parte inferior  izquierda del abdomen.  Las heces son sanguinolentas o de color negro, o de aspecto alquitranado.  Tiene dolor intenso, clicos, o meteorismo en el abdomen.  Tiene signos de deshidratacin, por ejemplo: ? [place] Else, muy escasa o falta de [place]. ? Labios agrietados. ? [employer]. ? Ojos hundidos. ? Somnolencia. ? Debilidad. Esta informacin no tiene Marine scientist el consejo del mdico. Asegrese de hacerle al mdico cualquier pregunta que tenga. Document Released: 06/01/2005 Document Revised: 05/21/2016 Document Reviewed: 11/13/2015 Elsevier Interactive Patient Education  [employer].

## 2019-06-13 NOTE — Progress Notes (Signed)
43 year old SHF G0 presents with complaint of yellow vaginal discharge with vaginal burning and low right abdominal discomfort for the past 2 weeks.  Some relief with Motrin.  Rates the discomfort at about a 5.  Denies urinary symptoms, vaginal odor, itching or fever.  Not sexually active for greater than 6 months due to Covid, same partner 5 years.  2016 TAH with bilateral salpingectomy with LSO for fibroids and endometriosis done in [place].  Elevated FSH on no HRT.  Medical problems include hypothyroidism.  [place].  Interpreter present  Exam: Appears well.  No CVAT.  Abdomen soft, no rebound slight tenderness with deep palpation right lower quadrant.  External genitalia mild erythema, speculum exam vaginal walls erythematous no discharge, odor noted, wet prep positive for yeast.  Bimanual mild tenderness with right lower quadrant palpation no fullness noted.  Yeast vaginitis Right lower quadrant pain  Plan: Diflucan 150 p.o. today, refill given if burning persists.  Yeast prevention discussed, loose clothing, open to air as able.  Reviewed burning sensation may be from dryness as well, instructed to use over-the-counter lubricant as needed with intercourse.  Instructed to schedule ultrasound if pain persists after Diflucan.

## 2019-07-27 ENCOUNTER — Other Ambulatory Visit: Payer: Self-pay | Admitting: Internal Medicine

## 2019-07-28 ENCOUNTER — Ambulatory Visit: Payer: Self-pay

## 2019-10-20 ENCOUNTER — Other Ambulatory Visit: Payer: Self-pay

## 2019-10-20 ENCOUNTER — Encounter: Payer: Self-pay | Admitting: Obstetrics & Gynecology

## 2019-10-20 ENCOUNTER — Ambulatory Visit (INDEPENDENT_AMBULATORY_CARE_PROVIDER_SITE_OTHER): Payer: 59 | Admitting: Obstetrics & Gynecology

## 2019-10-20 VITALS — BP 126/76 | Ht 62.5 in | Wt 127.0 lb

## 2019-10-20 DIAGNOSIS — Z780 Asymptomatic menopausal state: Secondary | ICD-10-CM

## 2019-10-20 DIAGNOSIS — N763 Subacute and chronic vulvitis: Secondary | ICD-10-CM

## 2019-10-20 DIAGNOSIS — Z01419 Encounter for gynecological examination (general) (routine) without abnormal findings: Secondary | ICD-10-CM

## 2019-10-20 DIAGNOSIS — Z90710 Acquired absence of both cervix and uterus: Secondary | ICD-10-CM | POA: Diagnosis not present

## 2019-10-20 MED ORDER — TRIAMCINOLONE ACETONIDE 0.025 % EX OINT: 1.0000 "application " | TOPICAL_OINTMENT | Freq: Two times a day (BID) | CUTANEOUS | 3 refills | Status: AC

## 2019-10-20 NOTE — Progress Notes (Signed)
Emily Ritter [birth year] [id number]   History:    44 y.o. G0  Stable boyfriend x 9 yrs  RP:  Established patient presenting for annual gyn exam   HPI: TAH/Bilateral Salpingectomy/Left Oophorectomy 03/2015 in [place] because of Fibroids/Endometriosis.No pelvic pain.  Vulvar irritation persists, but no abnormal d/c, no itching or odor. No pain with intercourse. [place] menopausal at 144.1 in 08/2017.  Well on no HRT.  Breasts normal. BMI 22.86.  Good fitness.  Health labs with Fam MD.  Past medical history,surgical history, family history and social history were all reviewed and documented in the EPIC chart.  Gynecologic History Patient's last menstrual period was 02/17/2015.  Obstetric History OB History  Gravida Para Term Preterm AB Living  0 0 0 0 0 0  SAB TAB Ectopic Multiple Live Births  0 0 0 0       ROS: A ROS was performed and pertinent positives and negatives are included in the history.  GENERAL: No fevers or chills. HEENT: No change in vision, no earache, sore throat or sinus congestion. NECK: No pain or stiffness. CARDIOVASCULAR: No chest pain or pressure. No palpitations. PULMONARY: No shortness of breath, cough or wheeze. GASTROINTESTINAL: No abdominal pain, nausea, vomiting or diarrhea, melena or bright red blood per rectum. GENITOURINARY: No urinary frequency, urgency, hesitancy or dysuria. MUSCULOSKELETAL: No joint or muscle pain, no back pain, no recent trauma. DERMATOLOGIC: No rash, no itching, no lesions. ENDOCRINE: No polyuria, polydipsia, no heat or cold intolerance. No recent change in weight. HEMATOLOGICAL: No anemia or easy bruising or bleeding. NEUROLOGIC: No headache, seizures, numbness, tingling or weakness. PSYCHIATRIC: No depression, no loss of interest in normal activity or change in sleep pattern.     Exam:   BP 126/76   Ht 5' 2.5" (1.588 m)   Wt 127 lb (57.6 kg)   LMP 02/17/2015   BMI 22.86 kg/m   Body mass index is 22.86 kg/m.  General  appearance : Well developed well nourished female. No acute distress HEENT: Eyes: no retinal hemorrhage or exudates,  Neck supple, trachea midline, no carotid bruits, no thyroidmegaly Lungs: Clear to auscultation, no rhonchi or wheezes, or rib retractions  Heart: Regular rate and rhythm, no murmurs or gallops Breast:Examined in sitting and supine position were symmetrical in appearance, no palpable masses or tenderness,  no skin retraction, no nipple inversion, no nipple discharge, no skin discoloration, no axillary or supraclavicular lymphadenopathy Abdomen: no palpable masses or tenderness, no rebound or guarding Extremities: no edema or skin discoloration or tenderness  Pelvic: Vulva: Mild erythema at the posterior fourchette and at the labia minora             Vagina: No gross lesions or discharge  Cervix/Uterus absent  Adnexa  Without masses or tenderness  Anus: Normal   Assessment/Plan:  44 y.o. female for annual exam   1. Well female exam with routine gynecological exam Gynecologic exam s/p TAH/LSO.  No indication to repeat a Pap test this year.  Breasts normal.  Mammo 05/2019 Negative.  Health Labs with Fam MD.  Good BMI 22.86.  Continue with fitness and healthy nutrition.  2. S/P TAH (total abdominal hysterectomy)  3. Postmenopause Well on no HRT.  Vit D supplements, Ca++ 1200 mg daily, regular weightbearing physical activities.  4. Chronic vulvitis No evidence of infection.  Precautions reviewed.  Will treat with Kenalog [id number] a thin application on affected vulva twice a day x 2 weeks, then wean.  Other orders - triamcinolone (  KENALOG) 0.025 % ointment; Apply 1 application topically 2 (two) times daily for 14 days. Then wean over 2 weeks.  Princess Bruins MD, 9:20 AM 10/20/2019

## 2019-10-20 NOTE — Patient Instructions (Signed)
1. Well female exam with routine gynecological exam Gynecologic exam s/p TAH/LSO.  No indication to repeat a Pap test this year.  Breasts normal.  Mammo 05/2019 Negative.  Health Labs with Fam MD.  Good BMI 22.86.  Continue with fitness and healthy nutrition.  2. S/P TAH (total abdominal hysterectomy)  3. Postmenopause Well on no HRT.  Vit D supplements, Ca++ 1200 mg daily, regular weightbearing physical activities.  4. Chronic vulvitis No evidence of infection.  Precautions reviewed.  Will treat with Kenalog [id number] a thin application on affected vulva twice a day x 2 weeks, then wean.  Other orders - triamcinolone (KENALOG) 0.025 % ointment; Apply 1 application topically 2 (two) times daily for 14 days. Then wean over 2 weeks.  Jandy, it was a pleasure seeing you today!

## 2019-10-21 LAB — CBC
HCT: 40.3 % (ref 35.0–45.0)
Hemoglobin: 13.3 g/dL (ref 11.7–15.5)
MCH: 29.1 pg (ref 27.0–33.0)
MCHC: 33 g/dL (ref 32.0–36.0)
MCV: 88.2 fL (ref 80.0–100.0)
MPV: 8.8 fL (ref 7.5–12.5)
Platelets: 350 10*3/uL (ref 140–400)
RBC: 4.57 10*6/uL (ref 3.80–5.10)
RDW: 12.1 % (ref 11.0–15.0)
WBC: 4.5 10*3/uL (ref 3.8–10.8)

## 2019-10-21 LAB — COMPREHENSIVE METABOLIC PANEL
AG Ratio: 1.6 (calc) (ref 1.0–2.5)
ALT: 23 U/L (ref 6–29)
AST: 21 U/L (ref 10–30)
Albumin: 4.5 g/dL (ref 3.6–5.1)
Alkaline phosphatase (APISO): 67 U/L (ref 31–125)
BUN: 12 mg/dL (ref 7–25)
CO2: 28 mmol/L (ref 20–32)
Calcium: 9.4 mg/dL (ref 8.6–10.2)
Chloride: 104 mmol/L (ref 98–110)
Creat: 0.6 mg/dL (ref 0.50–1.10)
Globulin: 2.9 g/dL (calc) (ref 1.9–3.7)
Glucose, Bld: 76 mg/dL (ref 65–99)
Potassium: 4 mmol/L (ref 3.5–5.3)
Sodium: 140 mmol/L (ref 135–146)
Total Bilirubin: 0.9 mg/dL (ref 0.2–1.2)
Total Protein: 7.4 g/dL (ref 6.1–8.1)

## 2019-10-21 LAB — LIPID PANEL
Cholesterol: 194 mg/dL (ref <200)
HDL: 64 mg/dL (ref >=50)
LDL Cholesterol (Calc): 114 mg/dL (calc) — ABNORMAL HIGH
Non-HDL Cholesterol (Calc): 130 mg/dL (calc) — ABNORMAL HIGH (ref <130)
Total CHOL/HDL Ratio: 3 (calc) (ref <5.0)
Triglycerides: 70 mg/dL (ref <150)

## 2019-10-21 LAB — THYROID PANEL WITH TSH
Free Thyroxine Index: 2.8 (ref 1.4–3.8)
T3 Uptake: 34 % (ref 22–35)
T4, Total: 8.3 ug/dL (ref 5.1–11.9)
TSH: 1.34 mIU/L

## 2019-10-21 LAB — VITAMIN D 25 HYDROXY (VIT D DEFICIENCY, FRACTURES): Vit D, 25-Hydroxy: 65 ng/mL (ref 30–100)

## 2019-11-05 ENCOUNTER — Other Ambulatory Visit: Payer: Self-pay | Admitting: Internal Medicine

## 2019-11-06 ENCOUNTER — Other Ambulatory Visit: Payer: Self-pay

## 2019-11-06 ENCOUNTER — Ambulatory Visit (INDEPENDENT_AMBULATORY_CARE_PROVIDER_SITE_OTHER): Payer: 59 | Admitting: Internal Medicine

## 2019-11-06 VITALS — BP 109/69 | HR 77 | Temp 97.5°F | Resp 16 | Ht 62.5 in | Wt 122.5 lb

## 2019-11-06 DIAGNOSIS — H1131 Conjunctival hemorrhage, right eye: Secondary | ICD-10-CM

## 2019-11-06 NOTE — Progress Notes (Signed)
Pre visit review using our clinic review tool, if applicable. No additional management support is needed unless otherwise documented below in the visit note. 

## 2019-11-06 NOTE — Patient Instructions (Signed)
Please see your optometrist within the next few days  Your condition is sub-conjunctival hemorrhage  If you have other problems such as light intolerance, poor vision, eye pain: Seek medical advice immediately

## 2019-11-06 NOTE — Progress Notes (Signed)
Subjective:    Patient ID: Emily Ritter, female    DOB: 1975-11-02, 44 y.o.   MRN: [id number]  DOS:  11/06/2019 Type of visit - description: Acute 2 days ago noted redness at the temporal side of the right conjunctiva.  The redness was quite noticeable but limited to that area. At the time she did not have any eye pain, poor vision, tearing, FB sensation, h/o injury or eye pain per se. The next day the area looks better.   She did experience a mild right-sided headache and some dizziness, better with one Tylenol.  Symptoms have not resurfaced.  No recent ambulatory BP  No recent cough, nausea or vomiting    Review of Systems See above   Past Medical History:  Diagnosis Date  . Anxiety and depression   . Asthma   . Endometriosis   . Frequent headaches   . Hypothyroidism   . Melasma   . Vitamin D deficiency     Past Surgical History:  Procedure Laterality Date  . ABDOMINAL HYSTERECTOMY  03/2015   done in [place], [place] has R ovary  . DIAGNOSTIC LAPAROSCOPY  1999   endometriosis  . LAPAROSCOPIC OVARIAN CYSTECTOMY  2002  . OOPHORECTOMY Left 03-2015  . TAH/LSO    . THYROID SURGERY  2004   lump removed    Allergies as of 11/06/2019      Reactions   Penicillins Other (See Comments)   Unknown   Amoxicillin Rash      Medication List       Accurate as of Nov 06, 2019  3:41 PM. If you have any questions, ask your nurse or doctor.        levothyroxine 75 MCG tablet Commonly known as: SYNTHROID TAKE 1 TABLET (75 MCG TOTAL) BY MOUTH DAILY BEFORE BREAKFAST. APPOINTMENT NEEDED FOR FURTHER REFILLS   multivitamin tablet Take 1 tablet by mouth daily.          Objective:   Physical Exam Eyes:     BP 109/69 (BP Location: Right Arm, Patient Position: Sitting, Cuff Size: Small)   Pulse 77   Temp (!) 97.5 F (36.4 C) (Temporal)   Resp 16   Ht 5' 2.5" (1.588 m)   Wt 122 lb 8 oz (55.6 kg)   LMP 02/17/2015   SpO2 100%   BMI 22.05 kg/m  General:   Well  developed, NAD, BMI noted. HEENT:  Normocephalic . Face symmetric, atraumatic. EOMI.  Pupils equal and reactive.  Anterior chambers normal.  No eye discharge or light intolerance.  See graphic Neurologic:  alert & oriented X3.  Speech normal, gait appropriate for age and unassisted Psych--  Cognition and judgment appear intact.  Cooperative with normal attention span and concentration.  Behavior appropriate. No anxious or depressed appearing.      Assessment    Assessment Hypothyroidism Anxiety depression Rosacea  Vitamin D deficiency Frequent headaches H/o asthma  H/o endometriosis, s/p hysterectomy, still has R ovary  COVID-19 04-2019  PLAN Subconjunctival hemorrhage: Explained the patient the diagnosis, advised this is a benign condition.  Nevertheless recommend to be check by optometrist for a routine screening for glaucoma.  She uses glasses but no contacts. She did have mild headache and dizziness the following day, mild symptoms, resolved after taking 1 Tylenol, rec observation.    This visit occurred during the SARS-CoV-2 public health emergency.  Safety protocols were in place, including screening questions prior to the visit, additional usage of staff PPE, and extensive  cleaning of exam room while observing appropriate contact time as indicated for disinfecting solutions.

## 2019-11-07 NOTE — Assessment & Plan Note (Signed)
Subconjunctival hemorrhage: Explained the patient the diagnosis, advised this is a benign condition.  Nevertheless recommend to be check by optometrist for a routine screening for glaucoma.  She uses glasses but no contacts. She did have mild headache and dizziness the following day, mild symptoms, resolved after taking 1 Tylenol, rec observation.

## 2020-06-20 DIAGNOSIS — U071 COVID-19: Secondary | ICD-10-CM | POA: Diagnosis not present

## 2020-08-27 ENCOUNTER — Encounter: Payer: Self-pay | Admitting: Internal Medicine

## 2020-09-24 ENCOUNTER — Encounter: Payer: Self-pay | Admitting: Internal Medicine

## 2020-10-23 ENCOUNTER — Ambulatory Visit: Payer: 59 | Admitting: Obstetrics & Gynecology

## 2020-10-23 DIAGNOSIS — Z0289 Encounter for other administrative examinations: Secondary | ICD-10-CM

## 2020-11-27 ENCOUNTER — Encounter: Payer: Self-pay | Admitting: Internal Medicine

## 2020-11-27 ENCOUNTER — Other Ambulatory Visit: Payer: Self-pay

## 2020-11-27 ENCOUNTER — Ambulatory Visit (INDEPENDENT_AMBULATORY_CARE_PROVIDER_SITE_OTHER): Payer: BC Managed Care – PPO | Admitting: Internal Medicine

## 2020-11-27 VITALS — BP 102/70 | HR 73 | Temp 98.3°F | Resp 16 | Ht 63.0 in | Wt 130.0 lb

## 2020-11-27 DIAGNOSIS — R9389 Abnormal findings on diagnostic imaging of other specified body structures: Secondary | ICD-10-CM | POA: Diagnosis not present

## 2020-11-27 DIAGNOSIS — Z1231 Encounter for screening mammogram for malignant neoplasm of breast: Secondary | ICD-10-CM

## 2020-11-27 DIAGNOSIS — E010 Iodine-deficiency related diffuse (endemic) goiter: Secondary | ICD-10-CM | POA: Diagnosis not present

## 2020-11-27 DIAGNOSIS — E039 Hypothyroidism, unspecified: Secondary | ICD-10-CM

## 2020-11-27 DIAGNOSIS — I8393 Asymptomatic varicose veins of bilateral lower extremities: Secondary | ICD-10-CM

## 2020-11-27 DIAGNOSIS — Z0000 Encounter for general adult medical examination without abnormal findings: Secondary | ICD-10-CM

## 2020-11-27 DIAGNOSIS — Z1159 Encounter for screening for other viral diseases: Secondary | ICD-10-CM

## 2020-11-27 DIAGNOSIS — Z0001 Encounter for general adult medical examination with abnormal findings: Secondary | ICD-10-CM

## 2020-11-27 NOTE — Patient Instructions (Addendum)
We have placed a order for a mammogram for you, please stop downstairs ([address]) to schedule at your convenience.  Also schedule your thyroid ultrasound  Please proceed with the second COVID vaccination   [facility] : Get the blood work     [place], [place] Come back for physical exam in 1 year

## 2020-11-27 NOTE — Progress Notes (Signed)
Subjective:    Patient ID: Emily Ritter, female    DOB: Aug 27, 1975, 45 y.o.   MRN: [id number]  DOS:  11/27/2020 Type of visit - description: Here for CPX  Has several concerns  She did notice that from time to time the left side of her thyroid gets bigger and hurts. When that happened and she swallows she feels some discomfort. Varicose veins noted  Denies fever chills No weight loss   Wt Readings from Last 3 Encounters:  11/27/20 130 lb (59 kg)  11/06/19 122 lb 8 oz (55.6 kg)  10/20/19 127 lb (57.6 kg)    Review of Systems  Other than above, a 14 point review of systems is negative       Past Medical History:  Diagnosis Date   Anxiety and depression    Asthma    Endometriosis    Frequent headaches    Hypothyroidism    Melasma    Vitamin D deficiency     Past Surgical History:  Procedure Laterality Date   ABDOMINAL HYSTERECTOMY  03/2015   done in [place], [place] has R ovary   DIAGNOSTIC LAPAROSCOPY  1999   endometriosis   LAPAROSCOPIC OVARIAN CYSTECTOMY  2002   OOPHORECTOMY Left 03-2015   TAH/LSO     THYROID SURGERY  2004   lump removed   Social History   Socioeconomic History   Marital status: Single    Spouse name: Not on file   Number of children: 0   Years of education: Not on file   Highest education level: Not on file  Occupational History   Occupation:  [employer]   Tobacco Use   Smoking status: Never   Smokeless tobacco: Never  Vaping Use   Vaping Use: Never used  Substance and Sexual Activity   Alcohol use: No    Alcohol/week: 0.0 standard drinks   Drug use: No   Sexual activity: Yes    Partners: Male    Birth control/protection: None  Other Topics Concern   Not on file  Social History Narrative   Original from [place], moved to [place] ~ 2005   Divorced, lives w/ her nice   Social Determinants of Health   Financial Resource Strain: Not on file  Food Insecurity: Not on file  Transportation Needs: Not on file  Physical Activity:  Not on file  Stress: Not on file  Social Connections: Not on file  Intimate Partner Violence: Not on file    Allergies as of 11/27/2020       Reactions   Penicillins Other (See Comments)   Unknown   Amoxicillin Rash        Medication List        Accurate as of November 27, 2020 11:59 PM. If you have any questions, ask your nurse or doctor.          levothyroxine 75 MCG tablet Commonly known as: SYNTHROID TAKE 1 TABLET (75 MCG TOTAL) BY MOUTH DAILY BEFORE BREAKFAST. APPOINTMENT NEEDED FOR FURTHER REFILLS   multivitamin tablet Take 1 tablet by mouth daily.           Objective:   Physical Exam BP 102/70 (BP Location: Left Arm, Patient Position: Sitting, Cuff Size: Small)   Pulse 73   Temp 98.3 F (36.8 C) (Oral)   Resp 16   Ht 5\' 3"  (1.6 m)   Wt 130 lb (59 kg)   LMP 02/17/2015   SpO2 99%   BMI 23.03 kg/m  General: Well developed,  NAD, BMI noted Neck: S/p R thyroidectomy, left side seems a slightly enlarged, minimally tender. HEENT:  Normocephalic . Face symmetric, atraumatic Lungs:  CTA B Normal respiratory effort, no intercostal retractions, no accessory muscle use. Heart: RRR,  no murmur.  Abdomen:  Not distended, soft, non-tender. No rebound or rigidity.   Lower extremities: no pretibial edema bilaterally  Skin: Lower extremities with few "spider" varicose veins, superficial.  Few slightly enlarged veins noted as well Neurologic:  alert & oriented X3.  Speech normal, gait appropriate for age and unassisted Strength symmetric and appropriate for age.  Psych: Cognition and judgment appear intact.  Cooperative with normal attention span and concentration.  Behavior appropriate. No anxious or depressed appearing.     Assessment     Assessment Hypothyroidism: s/p R partial thyroidectomy ~ 2,000 Anxiety depression Rosacea  Vitamin D deficiency Frequent headaches H/o asthma  H/o endometriosis, s/p hysterectomy, still has R ovary  COVID-19  04-2019  PLAN Here for CPX Hypothyroidism: Had a right thyroidectomy years ago, now on Synthroid, from time to time the L side of the thyroid gets larger and tender. Plan: Check TSH, ultrasound, refer to Endo. Vitamin D deficiency: On OTCs daily.  Last level very good. Superficial "spider" veins of the lower extremities: Observation Early varicose veins: Very mild enlargement of lower extremity veins mostly at the inner aspect of the left calf, from time to time she has pain there, recommend compression stockings, if the area ever gets very tender and red she needs to be seen. RTC 1 year   In addition to CPX, I also discussed hypothyroidism, she reports the thyroid gland is enlarged at times, refer to Endo and get an ultrasound. She also had a number of questions about the varicose veins. This visit occurred during the SARS-CoV-2 public health emergency.  Safety protocols were in place, including screening questions prior to the visit, additional usage of staff PPE, and extensive cleaning of exam room while observing appropriate contact time as indicated for disinfecting solutions.

## 2020-11-28 ENCOUNTER — Encounter: Payer: Self-pay | Admitting: Internal Medicine

## 2020-11-28 LAB — COMPREHENSIVE METABOLIC PANEL
ALT: 12 U/L (ref 0–35)
AST: 16 U/L (ref 0–37)
Albumin: 4.6 g/dL (ref 3.5–5.2)
Alkaline Phosphatase: 68 U/L (ref 39–117)
BUN: 14 mg/dL (ref 6–23)
CO2: 29 mEq/L (ref 19–32)
Calcium: 10 mg/dL (ref 8.4–10.5)
Chloride: 102 mEq/L (ref 96–112)
Creatinine, Ser: 0.62 mg/dL (ref 0.40–1.20)
GFR: 107.04 mL/min (ref >60.00)
Glucose, Bld: 78 mg/dL (ref 70–99)
Potassium: 3.9 mEq/L (ref 3.5–5.1)
Sodium: 139 mEq/L (ref 135–145)
Total Bilirubin: 0.9 mg/dL (ref 0.2–1.2)
Total Protein: 7.5 g/dL (ref 6.0–8.3)

## 2020-11-28 LAB — LIPID PANEL
Cholesterol: 209 mg/dL — ABNORMAL HIGH (ref 0–200)
HDL: 68.4 mg/dL (ref >39.00)
LDL Cholesterol: 126 mg/dL — ABNORMAL HIGH (ref 0–99)
NonHDL: 141.07
Total CHOL/HDL Ratio: 3
Triglycerides: 76 mg/dL (ref 0.0–149.0)
VLDL: 15.2 mg/dL (ref 0.0–40.0)

## 2020-11-28 LAB — CBC WITH DIFFERENTIAL/PLATELET
Basophils Absolute: 0 10*3/uL (ref 0.0–0.1)
Basophils Relative: 0.6 % (ref 0.0–3.0)
Eosinophils Absolute: 0.3 10*3/uL (ref 0.0–0.7)
Eosinophils Relative: 3.9 % (ref 0.0–5.0)
HCT: 38.7 % (ref 36.0–46.0)
Hemoglobin: 12.9 g/dL (ref 12.0–15.0)
Lymphocytes Relative: 33.9 % (ref 12.0–46.0)
Lymphs Abs: 2.2 10*3/uL (ref 0.7–4.0)
MCHC: 33.4 g/dL (ref 30.0–36.0)
MCV: 87.5 fl (ref 78.0–100.0)
Monocytes Absolute: 0.4 10*3/uL (ref 0.1–1.0)
Monocytes Relative: 6.8 % (ref 3.0–12.0)
Neutro Abs: 3.6 10*3/uL (ref 1.4–7.7)
Neutrophils Relative %: 54.8 % (ref 43.0–77.0)
Platelets: 363 10*3/uL (ref 150.0–400.0)
RBC: 4.42 Mil/uL (ref 3.87–5.11)
RDW: 13.2 % (ref 11.5–15.5)
WBC: 6.5 10*3/uL (ref 4.0–10.5)

## 2020-11-28 LAB — TSH: TSH: 2 u[IU]/mL (ref 0.35–4.50)

## 2020-11-28 LAB — HEPATITIS C ANTIBODY
Hepatitis C Ab: NONREACTIVE
SIGNAL TO CUT-OFF: 0.01 (ref <1.00)

## 2020-11-28 NOTE — Assessment & Plan Note (Signed)
Here for CPX Hypothyroidism: Had a right thyroidectomy years ago, now on Synthroid, from time to time the L side of the thyroid gets larger and tender. Plan: Check TSH, ultrasound, refer to Endo. Vitamin D deficiency: On OTCs daily.  Last level very good. Superficial "spider" veins of the lower extremities: Observation Early varicose veins: Very mild enlargement of lower extremity veins mostly at the inner aspect of the left calf, from time to time she has pain there, recommend compression stockings, if the area ever gets very tender and red she needs to be seen. RTC 1 year

## 2020-11-28 NOTE — Assessment & Plan Note (Signed)
-  Td 2017 - covid vax x 1 , booster recommended -CCS:  s/p Cscope ~ July 2014, dx w/ internal hemorrhoids Dr Ardis Hughs -Female care: S/p hysterectomy, no malignancy per pt, had endometriosis Saw gyn 10/2019  [facility]G 05-2019 ([facility]), mammogram ordered Diet: Counseling, she takes almost daily walks Labs: CMP, FLP, CBC, TSH, hep C

## 2020-12-03 ENCOUNTER — Ambulatory Visit (HOSPITAL_BASED_OUTPATIENT_CLINIC_OR_DEPARTMENT_OTHER)
Admission: RE | Admit: 2020-12-03 | Discharge: 2020-12-03 | Disposition: A | Discharge status: Home / Self Care | Payer: BC Managed Care – PPO | Source: Ambulatory Visit | Attending: Internal Medicine | Admitting: Internal Medicine

## 2020-12-03 ENCOUNTER — Ambulatory Visit (HOSPITAL_BASED_OUTPATIENT_CLINIC_OR_DEPARTMENT_OTHER): Payer: BC Managed Care – PPO

## 2020-12-03 ENCOUNTER — Other Ambulatory Visit: Payer: Self-pay

## 2020-12-03 ENCOUNTER — Encounter (HOSPITAL_BASED_OUTPATIENT_CLINIC_OR_DEPARTMENT_OTHER): Payer: Self-pay

## 2020-12-03 DIAGNOSIS — Z1231 Encounter for screening mammogram for malignant neoplasm of breast: Secondary | ICD-10-CM | POA: Insufficient documentation

## 2020-12-03 DIAGNOSIS — E010 Iodine-deficiency related diffuse (endemic) goiter: Secondary | ICD-10-CM | POA: Insufficient documentation

## 2020-12-03 DIAGNOSIS — E042 Nontoxic multinodular goiter: Secondary | ICD-10-CM | POA: Diagnosis not present

## 2020-12-03 DIAGNOSIS — Z9889 Other specified postprocedural states: Secondary | ICD-10-CM | POA: Diagnosis not present

## 2020-12-24 ENCOUNTER — Other Ambulatory Visit (HOSPITAL_BASED_OUTPATIENT_CLINIC_OR_DEPARTMENT_OTHER): Payer: BC Managed Care – PPO

## 2021-01-28 ENCOUNTER — Ambulatory Visit: Payer: BC Managed Care – PPO | Attending: Internal Medicine

## 2021-01-28 DIAGNOSIS — Z23 Encounter for immunization: Secondary | ICD-10-CM

## 2021-01-28 NOTE — Progress Notes (Signed)
   Covid-19 Vaccination Clinic  Name:  Emily Ritter    MRN: [id number] DOB: March 11, 1976  01/28/2021  Emily Ritter was observed post Covid-19 immunization for 15 minutes without incident. She was provided with Vaccine Information Sheet and instruction to access the V-Safe system.   Emily Ritter was instructed to call 911 with any severe reactions post vaccine: Difficulty breathing  Swelling of face and throat  A fast heartbeat  A bad rash all over body  Dizziness and weakness   Immunizations Administered     Name Date Dose VIS Date Route   PFIZER Comrnaty(Gray TOP) Covid-19 Vaccine 01/28/2021  1:14 PM 0.3 mL 05/23/2020 Intramuscular   Manufacturer: [employer]   Lot: [id number]   NDC: [phone]

## 2021-02-03 ENCOUNTER — Other Ambulatory Visit (HOSPITAL_BASED_OUTPATIENT_CLINIC_OR_DEPARTMENT_OTHER): Payer: Self-pay

## 2021-02-03 MED ORDER — COVID-19 MRNA VAC-TRIS(PFIZER) 30 MCG/0.3ML IM SUSP
INTRAMUSCULAR | 0 refills | Status: AC
  Filled 2021-02-03: qty 0.3, 1d supply, fill #0

## 2021-07-14 IMAGING — MG DIGITAL SCREENING BILAT W/ TOMO W/ CAD
8 series · 8 of 24 positions shown · non-contrast
Comparison: None.

CLINICAL DATA: Screening.

EXAM:
DIGITAL SCREENING BILATERAL MAMMOGRAM WITH TOMO AND CAD

[R MLO synth-2D]
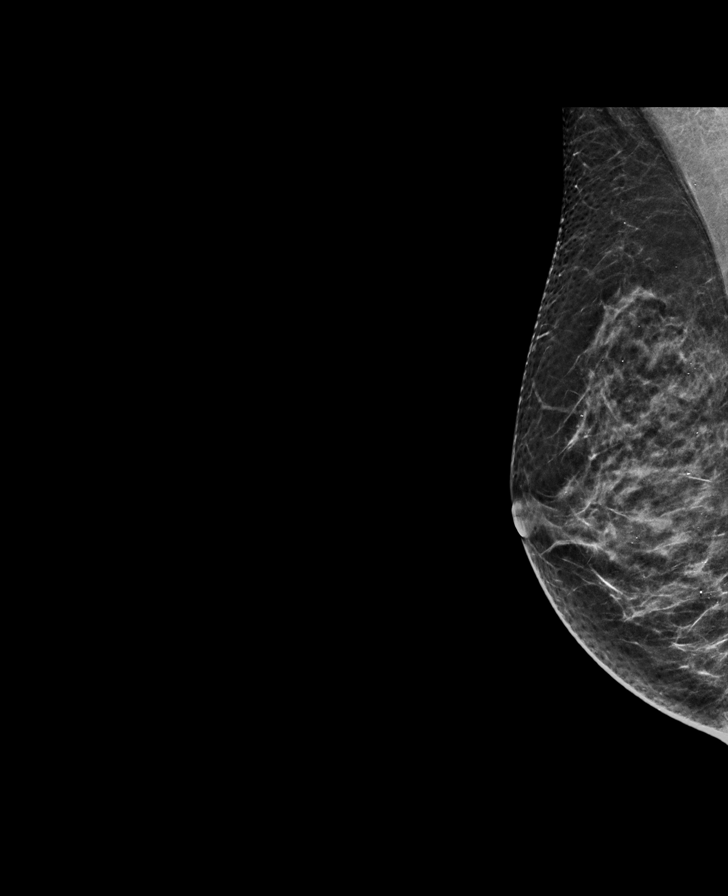

[R CC synth-2D]
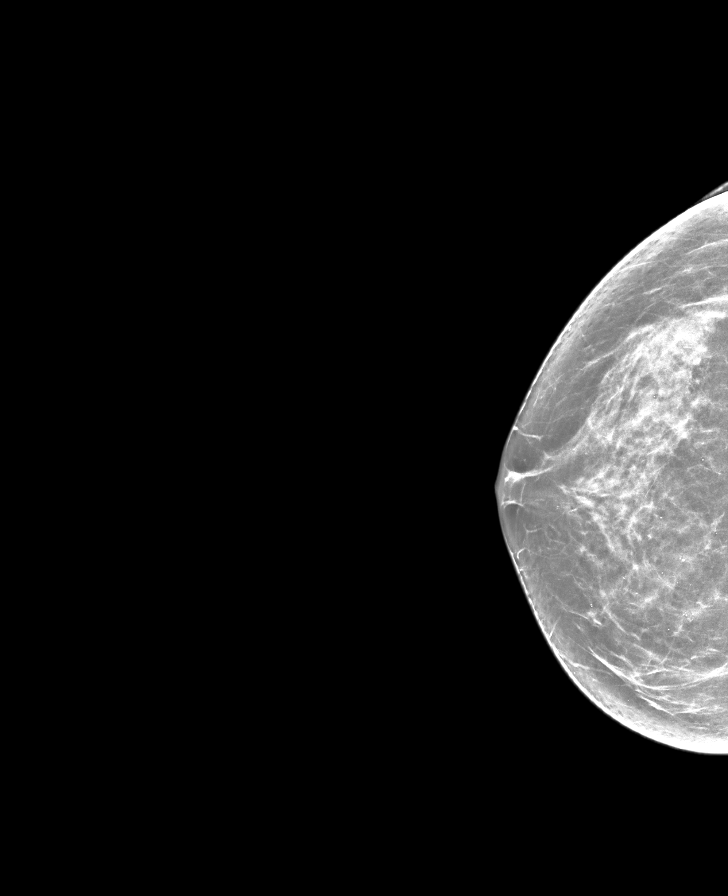

[L MLO synth-2D]
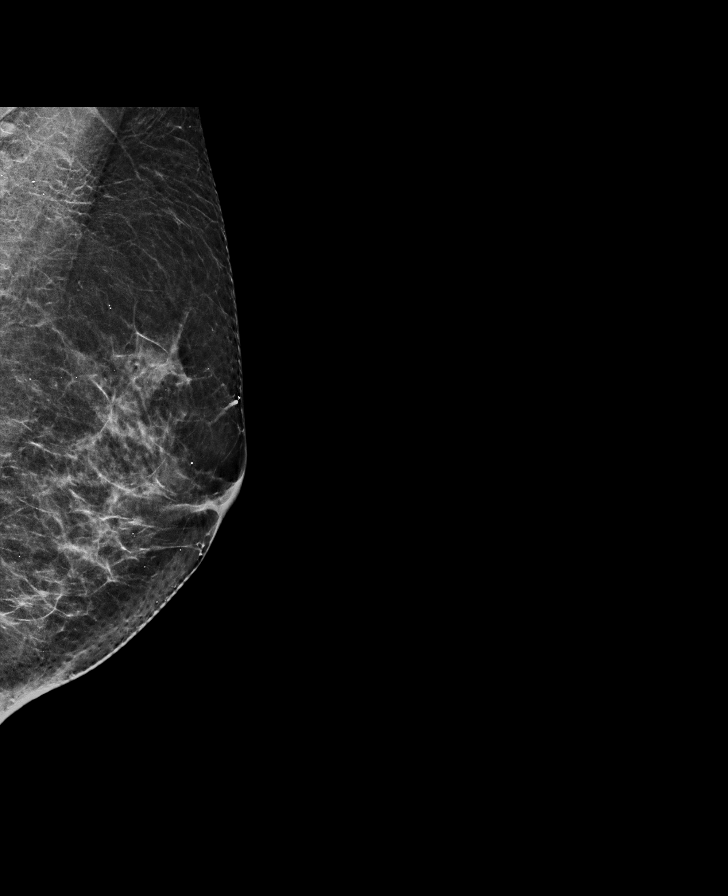

[L CC synth-2D]
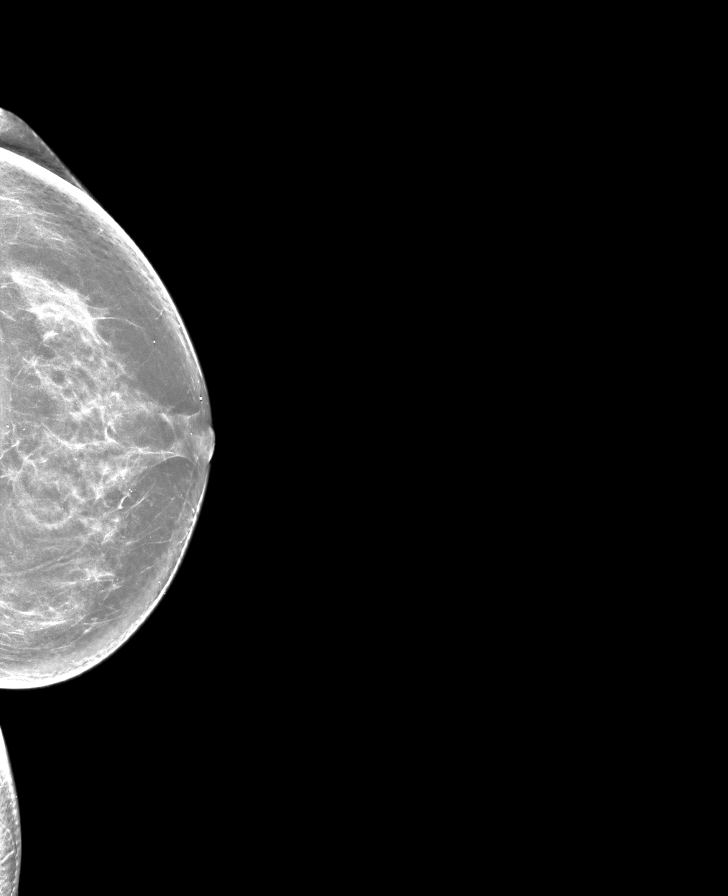

[L CC tomo · tomo slice 37/73.0]
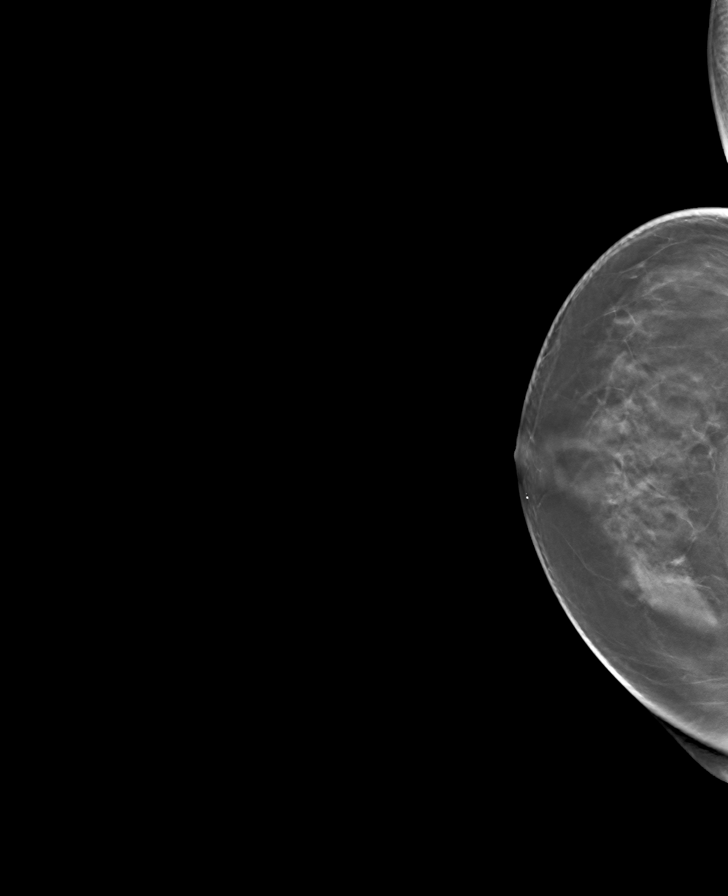

[L MLO tomo · tomo slice 33/64.0]
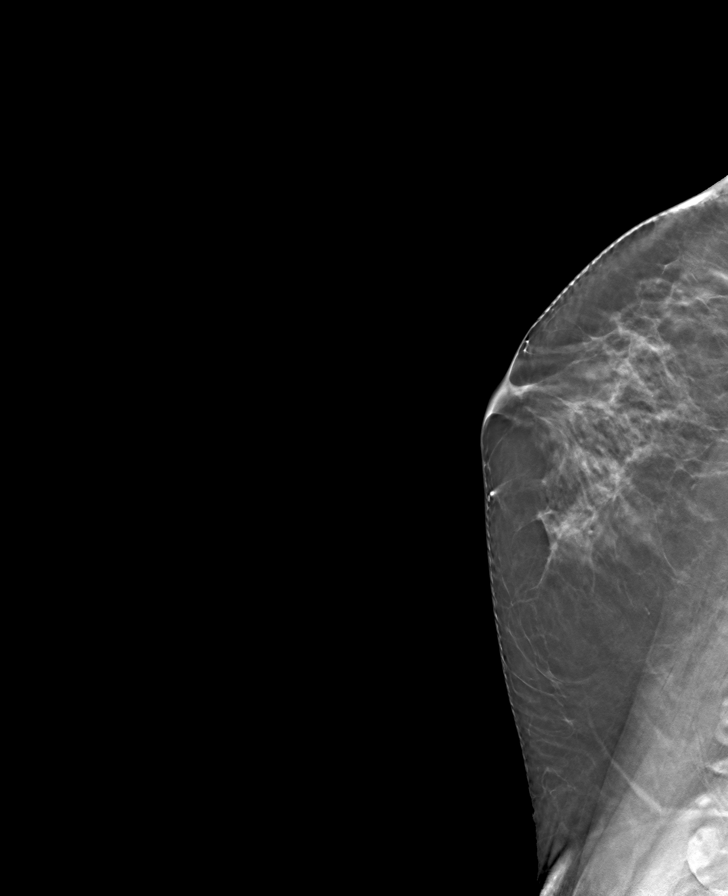

[R MLO tomo · tomo slice 31/62.0]
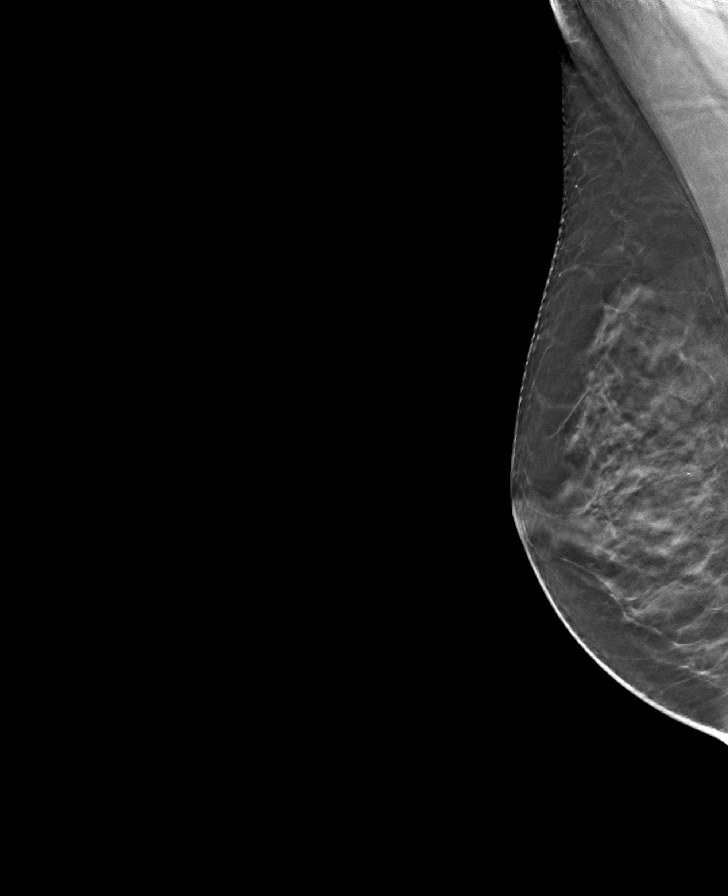

[R CC tomo · tomo slice 35/70.0]
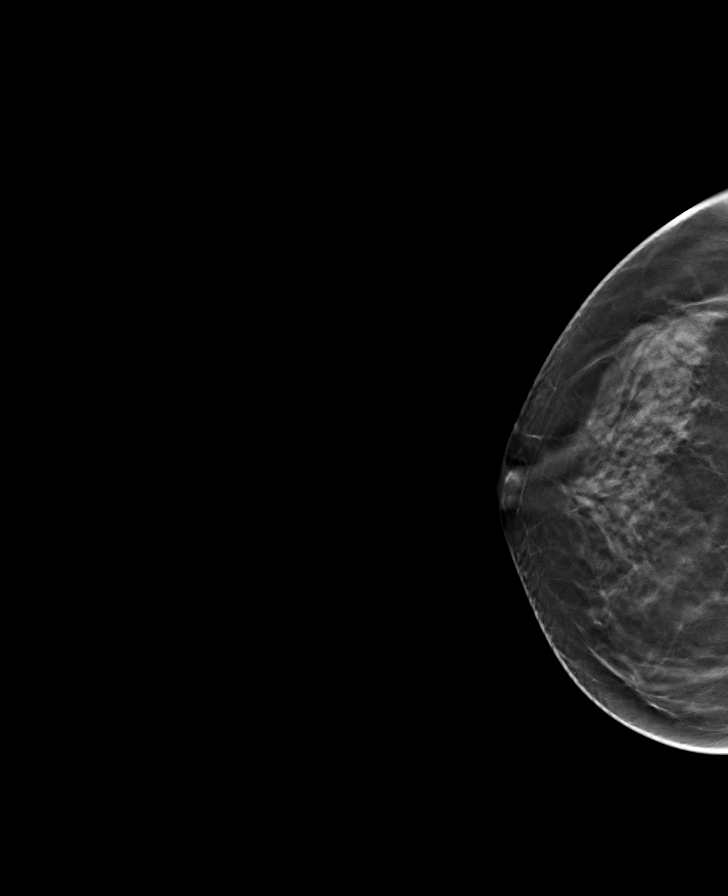

[8 of 24 positions shown; findings below may reference images not displayed]

ACR Breast Density Category b: There are scattered areas of
fibroglandular density.
FINDINGS: There are no findings suspicious for malignancy. Images were
processed with CAD.
IMPRESSION: No mammographic evidence of malignancy. A result letter of this
screening mammogram will be mailed directly to the patient.

RECOMMENDATION:
Screening mammogram in one year. (Code:Y5-G-EJ6)

BI-RADS CATEGORY  1: Negative.

## 2021-11-28 ENCOUNTER — Encounter: Payer: BC Managed Care – PPO | Admitting: Internal Medicine
